# Patient Record
Sex: Female | Born: 1995 | Race: White | Hispanic: No | Marital: Single | Smoking: Never smoker
Health system: Southern US, Community
[De-identification: ages and names within clinical notes are randomized; demographics above are authoritative.]

## PROBLEM LIST (undated history)

## (undated) DIAGNOSIS — E7800 Pure hypercholesterolemia, unspecified: Secondary | ICD-10-CM

## (undated) DIAGNOSIS — F32A Depression, unspecified: Secondary | ICD-10-CM

## (undated) DIAGNOSIS — F329 Major depressive disorder, single episode, unspecified: Secondary | ICD-10-CM

## (undated) DIAGNOSIS — F419 Anxiety disorder, unspecified: Secondary | ICD-10-CM

---

## 2014-03-25 DIAGNOSIS — F331 Major depressive disorder, recurrent, moderate: Secondary | ICD-10-CM | POA: Insufficient documentation

## 2014-03-25 DIAGNOSIS — G43909 Migraine, unspecified, not intractable, without status migrainosus: Secondary | ICD-10-CM | POA: Insufficient documentation

## 2014-03-25 DIAGNOSIS — F3281 Premenstrual dysphoric disorder: Secondary | ICD-10-CM | POA: Insufficient documentation

## 2014-04-21 DIAGNOSIS — F4010 Social phobia, unspecified: Secondary | ICD-10-CM | POA: Insufficient documentation

## 2017-11-13 ENCOUNTER — Encounter (HOSPITAL_COMMUNITY): Payer: Self-pay | Admitting: Emergency Medicine

## 2017-11-13 ENCOUNTER — Emergency Department (HOSPITAL_COMMUNITY): Payer: BLUE CROSS/BLUE SHIELD

## 2017-11-13 ENCOUNTER — Emergency Department (HOSPITAL_COMMUNITY)
Admission: EM | Admit: 2017-11-13 | Discharge: 2017-11-13 | Disposition: A | Discharge status: Home / Self Care | Payer: BLUE CROSS/BLUE SHIELD | Attending: Emergency Medicine | Admitting: Emergency Medicine

## 2017-11-13 ENCOUNTER — Other Ambulatory Visit: Payer: Self-pay

## 2017-11-13 DIAGNOSIS — R1031 Right lower quadrant pain: Secondary | ICD-10-CM | POA: Diagnosis not present

## 2017-11-13 DIAGNOSIS — R109 Unspecified abdominal pain: Secondary | ICD-10-CM

## 2017-11-13 DIAGNOSIS — Z3491 Encounter for supervision of normal pregnancy, unspecified, first trimester: Secondary | ICD-10-CM | POA: Diagnosis not present

## 2017-11-13 DIAGNOSIS — Z79899 Other long term (current) drug therapy: Secondary | ICD-10-CM | POA: Insufficient documentation

## 2017-11-13 HISTORY — DX: Pure hypercholesterolemia, unspecified: E78.00

## 2017-11-13 HISTORY — DX: Major depressive disorder, single episode, unspecified: F32.9

## 2017-11-13 HISTORY — DX: Depression, unspecified: F32.A

## 2017-11-13 HISTORY — DX: Anxiety disorder, unspecified: F41.9

## 2017-11-13 LAB — COMPREHENSIVE METABOLIC PANEL
ALT: 16 U/L (ref 0–44)
ANION GAP: 6 (ref 5–15)
AST: 16 U/L (ref 15–41)
Albumin: 3.9 g/dL (ref 3.5–5.0)
Alkaline Phosphatase: 73 U/L (ref 38–126)
BILIRUBIN TOTAL: 0.5 mg/dL (ref 0.3–1.2)
BUN: 6 mg/dL (ref 6–20)
CHLORIDE: 109 mmol/L (ref 98–111)
CO2: 23 mmol/L (ref 22–32)
Calcium: 9.2 mg/dL (ref 8.9–10.3)
Creatinine, Ser: 0.49 mg/dL (ref 0.44–1.00)
GFR calc Af Amer: >60 mL/min (ref >60)
Glucose, Bld: 88 mg/dL (ref 70–99)
POTASSIUM: 4.1 mmol/L (ref 3.5–5.1)
Sodium: 138 mmol/L (ref 135–145)
TOTAL PROTEIN: 6.9 g/dL (ref 6.5–8.1)

## 2017-11-13 LAB — CBC WITH DIFFERENTIAL/PLATELET
ABS IMMATURE GRANULOCYTES: 0.01 10*3/uL (ref 0.00–0.07)
BASOS ABS: 0.1 10*3/uL (ref 0.0–0.1)
BASOS PCT: 1 %
Eosinophils Absolute: 0.4 10*3/uL (ref 0.0–0.5)
Eosinophils Relative: 6 %
HCT: 38 % (ref 36.0–46.0)
Hemoglobin: 12.4 g/dL (ref 12.0–15.0)
Immature Granulocytes: 0 %
LYMPHS PCT: 32 %
Lymphs Abs: 2.3 10*3/uL (ref 0.7–4.0)
MCH: 30.2 pg (ref 26.0–34.0)
MCHC: 32.6 g/dL (ref 30.0–36.0)
MCV: 92.5 fL (ref 80.0–100.0)
MONO ABS: 0.5 10*3/uL (ref 0.1–1.0)
Monocytes Relative: 7 %
NEUTROS ABS: 3.8 10*3/uL (ref 1.7–7.7)
NRBC: 0 % (ref 0.0–0.2)
Neutrophils Relative %: 54 %
PLATELETS: 306 10*3/uL (ref 150–400)
RBC: 4.11 MIL/uL (ref 3.87–5.11)
RDW: 13.3 % (ref 11.5–15.5)
WBC: 7 10*3/uL (ref 4.0–10.5)

## 2017-11-13 LAB — URINALYSIS, ROUTINE W REFLEX MICROSCOPIC
BILIRUBIN URINE: NEGATIVE
GLUCOSE, UA: NEGATIVE mg/dL
HGB URINE DIPSTICK: NEGATIVE
KETONES UR: NEGATIVE mg/dL
LEUKOCYTES UA: NEGATIVE
Nitrite: NEGATIVE
PH: 6 (ref 5.0–8.0)
PROTEIN: NEGATIVE mg/dL
Specific Gravity, Urine: <1.005 — ABNORMAL LOW (ref 1.005–1.030)

## 2017-11-13 LAB — I-STAT BETA HCG BLOOD, ED (MC, WL, AP ONLY): I-stat hCG, quantitative: >2000 m[IU]/mL — ABNORMAL HIGH (ref <5)

## 2017-11-13 LAB — LIPASE, BLOOD: LIPASE: 38 U/L (ref 11–51)

## 2017-11-13 LAB — HCG, QUANTITATIVE, PREGNANCY: HCG, BETA CHAIN, QUANT, S: 18628 m[IU]/mL — AB (ref <5)

## 2017-11-13 MED ORDER — PRENATAL VITAMIN 27-0.8 MG PO TABS: 1.0000 | ORAL_TABLET | Freq: Every day | ORAL | 3 refills | Status: AC

## 2017-11-13 NOTE — Discharge Instructions (Signed)
We saw in the ER for abdominal pain. It is unclear why you are having the pain.  Ultrasound of your abdomen and kidneys do not show any evidence of gallstones, kidney stones, ectopic pregnancy.  We would want you to follow-up with [facility] doctor as soon as possible. Please return to the ER if your symptoms worsen; you have increased pain, fevers, chills, inability to keep any medications down. Otherwise see the outpatient doctor as requested.

## 2017-11-13 NOTE — ED Triage Notes (Signed)
Patient complaining of right flank pain x 2 weeks. States she is approximately 4-[redacted] weeks pregnant. Denies dysuria. Denies vaginal bleeding.

## 2017-11-13 NOTE — ED Provider Notes (Signed)
[facility] EMERGENCY DEPARTMENT Provider Note   CSN: [id number] Arrival date & time: 11/13/17  1056     History   Chief Complaint Chief Complaint  Patient presents with  . Flank Pain    HPI Patricia Fleming is a 22 y.o. female.  HPI  22 year old G1, P0 woman comes in with chief complaint of flank pain.  Patient states that for the past 2 weeks she has been having intermittent episodes of right-sided abdominal and flank pain.  Her pain is located in the right lower quadrant and also in the right flank and right hip.  Pain is unprovoked, and it appears that it is worse at nighttime.  She denies any specific evoking factors or any specific aggravating or relieving factors.  Patient does not have any nausea, vomiting, fevers, chills, vaginal bleeding, UTI-like symptoms.  Past Medical History:  Diagnosis Date  . Anxiety   . Depression   . High cholesterol     There are no active problems to display for this patient.   History reviewed. No pertinent surgical history.   OB History    Gravida  1   Para      Term      Preterm      AB      Living        SAB      TAB      Ectopic      Multiple      Live Births               Home Medications    Prior to Admission medications   Medication Sig Start Date End Date Taking? Authorizing Provider  atorvastatin (LIPITOR) 20 MG tablet Take 20 mg by mouth at bedtime. 09/06/17  Yes [provider]  FLUoxetine (PROZAC) 10 MG capsule TAKE 1 CAPSULE BY MOUTH IN THE MORNING 10/17/17  Yes [provider]  levocetirizine (XYZAL) 2.5 MG/5ML solution Take 5 mg by mouth every evening.   Yes [provider]  LORazepam (ATIVAN) 0.5 MG tablet Take 0.5 mg by mouth 2 (two) times daily as needed. 09/24/17  Yes [provider]  montelukast (SINGULAIR) 10 MG tablet TAKE 1 TABLET BY MOUTH ONCE DAILY IN THE EVENING FOR 30 DAYS 10/28/17  Yes [provider]  PROAIR RESPICLICK 108 (90 Base)  MCG/ACT AEPB INHALE 1 TO 2 PUFFS BY MOUTH EVERY 4 TO 6 HOURS AS NEEDED FOR COUGH FOR 90 DAYS 09/18/17  Yes [provider]  amoxicillin-clavulanate (AUGMENTIN) 875-125 MG tablet Augmentin 875 mg-125 mg tablet  Take 1 tablet every 12 hours by oral route with meals.    [provider]  azelastine (OPTIVAR) 0.05 % ophthalmic solution Place into both eyes as needed (when on set occurs).  09/18/17   [provider]  azithromycin (ZITHROMAX) 1 g powder azithromycin 1 gram oral packet  Take 1 g by oral route.    [provider]  azithromycin (ZITHROMAX) 250 MG tablet Zithromax Z-Pak 250 mg tablet  TAKE 2 TABLETS (500 MG) BY ORAL ROUTE ONCE DAILY FOR 1 DAY THEN 1 TABLET (250 MG) BY ORAL ROUTE ONCE DAILY FOR 4 DAYS    [provider]  Prenatal Vit-Fe Fumarate-FA (PRENATAL VITAMIN) 27-0.8 MG TABS Take 1 tablet by mouth daily. 11/13/17 02/11/18  Derwood Kaplan, MD  prochlorperazine (COMPAZINE) 10 MG tablet Take 10 mg by mouth daily as needed for nausea or vomiting.  09/06/17   [provider]  SUMAtriptan (IMITREX) 25 MG  tablet as needed for migraine.  09/06/17   [provider]  Vitamin D, Ergocalciferol, (DRISDOL) 1.25 MG (50000 UT) CAPS capsule Take 50,000 Units by mouth once a week. 09/06/17   [provider]    Family History History reviewed. No pertinent family history.  Social History Social History   Tobacco Use  . Smoking status: Never Smoker  . Smokeless tobacco: Never Used  Substance Use Topics  . Alcohol use: Yes    Comment: occasionally  . Drug use: Yes    Types: Marijuana    Comment: occasionally     Allergies   Toradol [ketorolac tromethamine]   Review of Systems Review of Systems  Constitutional: Positive for activity change.  Respiratory: Negative for shortness of breath.   Cardiovascular: Negative for chest pain.  Gastrointestinal: Positive for abdominal pain. Negative for nausea and vomiting.    Genitourinary: Negative for dysuria and hematuria.  All other systems reviewed and are negative.    Physical Exam Updated Vital Signs BP 112/76   Pulse (!) 38   Temp (!) 97.5 F (36.4 C) (Oral)   Resp 17   Ht 5\' 2"  (1.575 m)   Wt 64 kg   LMP 09/22/2017   SpO2 100%   BMI 25.79 kg/m   Physical Exam  Constitutional: She is oriented to person, place, and time. She appears well-developed.  HENT:  Head: Normocephalic and atraumatic.  Eyes: EOM are normal.  Neck: Normal range of motion. Neck supple.  Cardiovascular: Normal rate.  Pulmonary/Chest: Effort normal.  Abdominal: Bowel sounds are normal. There is tenderness.  Right lower quadrant, upper quadrant and right flank tenderness. Hip range of motion is normal.  Neurological: She is alert and oriented to person, place, and time.  Skin: Skin is warm and dry.  Nursing note and vitals reviewed.    ED Treatments / Results  Labs (all labs ordered are listed, but only abnormal results are displayed) Labs Reviewed  URINALYSIS, ROUTINE W REFLEX MICROSCOPIC - Abnormal; Notable for the following components:      Result Value   Specific Gravity, Urine <1.005 (*)    All other components within normal limits  HCG, QUANTITATIVE, PREGNANCY - Abnormal; Notable for the following components:   hCG, Beta Chain, Quant, S 18,628 (*)    All other components within normal limits  I-STAT BETA HCG BLOOD, ED ([facility], [facility], [facility] ONLY) - Abnormal; Notable for the following components:   I-stat hCG, quantitative >2,000.0 (*)    All other components within normal limits  URINE CULTURE  COMPREHENSIVE METABOLIC PANEL  CBC WITH DIFFERENTIAL/PLATELET  LIPASE, BLOOD    EKG None  Radiology US Abdomen Complete  Result Date: 11/13/2017 CLINICAL DATA:  Right flank pain. EXAM: ABDOMEN ULTRASOUND COMPLETE COMPARISON:  None. FINDINGS: Gallbladder: No gallstones or wall thickening visualized. No sonographic Murphy sign noted by sonographer. Common bile duct:  Diameter: 3 mm which is within normal limits. Liver: No focal lesion identified. Within normal limits in parenchymal echogenicity. Portal vein is patent on color Doppler imaging with normal direction of blood flow towards the liver. IVC: No abnormality visualized. Pancreas: Visualized portion unremarkable. Spleen: Size and appearance within normal limits. Right Kidney: Length: 11.7 cm. Echogenicity within normal limits. No mass or hydronephrosis visualized. Left Kidney: Length: 11.1 cm. Echogenicity within normal limits. No mass or hydronephrosis visualized. Abdominal aorta: No aneurysm visualized. Other findings: None. IMPRESSION: No definite abnormality seen in the abdomen. Electronically Signed   By: Lupita Raider, M.D.   On:  11/13/2017 14:32   US Ob Comp Less 14 Wks  Result Date: 11/13/2017 CLINICAL DATA:  RIGHT upper quadrant and RIGHT flank pain. Gestational age by last menstrual period 7 weeks and 3 days. EXAM: OBSTETRIC <14 WK [place] AND TRANSVAGINAL OB US TECHNIQUE: Both transabdominal and transvaginal ultrasound examinations were performed for complete evaluation of the gestation as well as the maternal uterus, adnexal regions, and pelvic cul-de-sac. Transvaginal technique was performed to assess early pregnancy. COMPARISON:  None. FINDINGS: Intrauterine gestational sac: Present. Yolk sac:  Present. Embryo:  Present. Cardiac Activity: Present. Heart Rate: 101 bpm CRL:  53 mm   6 w   2 d                  [place] EDC: June 29, 2018 Subchorionic hemorrhage:  None visualized. Maternal uterus/adnexae: Normal appearance of the adnexa. No free fluid. IMPRESSION: Single live intrauterine pregnancy, gestational age by ultrasound 6 weeks and 2 days without immediate complication. Electronically Signed   By: Awilda Metro M.D.   On: 11/13/2017 14:35   US Ob Transvaginal  Result Date: 11/13/2017 CLINICAL DATA:  RIGHT upper quadrant and RIGHT flank pain. Gestational age by last menstrual period 7 weeks and 3 days.  EXAM: OBSTETRIC <14 WK [place] AND TRANSVAGINAL OB US TECHNIQUE: Both transabdominal and transvaginal ultrasound examinations were performed for complete evaluation of the gestation as well as the maternal uterus, adnexal regions, and pelvic cul-de-sac. Transvaginal technique was performed to assess early pregnancy. COMPARISON:  None. FINDINGS: Intrauterine gestational sac: Present. Yolk sac:  Present. Embryo:  Present. Cardiac Activity: Present. Heart Rate: 101 bpm CRL:  53 mm   6 w   2 d                  [place] EDC: June 29, 2018 Subchorionic hemorrhage:  None visualized. Maternal uterus/adnexae: Normal appearance of the adnexa. No free fluid. IMPRESSION: Single live intrauterine pregnancy, gestational age by ultrasound 6 weeks and 2 days without immediate complication. Electronically Signed   By: Awilda Metro M.D.   On: 11/13/2017 14:35    Procedures Procedures (including critical care time)  Medications Ordered in ED Medications - No data to display   Initial Impression / Assessment and Plan / ED Course  I have reviewed the triage vital signs and the nursing notes.  Pertinent labs & imaging results that were available during my care of the patient were reviewed by me and considered in my medical decision making (see chart for details).  Clinical Course as of Nov 14 1610  Wed Nov 13, 2017  1612 Results from the ER workup discussed with the patient face to face and all questions answered to the best of my ability.    [AN]  1612 Repeat evaluation is unchanged.  Stable for discharge.   [AN]    Clinical Course User Index [AN] Derwood Kaplan, MD    22 year old G1, P0 female comes in with chief complaint of abdominal pain and flank pain.  On exam she has right upper and lower abdominal pain without any Murphy's or McBurney's.  She has no peritoneal findings at all.  She also has flank tenderness.  Differential diagnosis would include ectopic pregnancy, ureteral stone, gallstones.  Does not  sound like a round ligament type pain.  Hip exam is normal.  Patient does not have any peritoneal findings and the pain is been off and on for past few days making appendicitis less likely as well.  Basic labs has been ordered  along with ultrasound.  Final Clinical Impressions(s) / ED Diagnoses   Final diagnoses:  Right flank pain  Right lower quadrant pain  Normal IUP (intrauterine pregnancy) on prenatal ultrasound, first trimester    ED Discharge Orders         Ordered    Prenatal Vit-Fe Fumarate-FA (PRENATAL VITAMIN) 27-0.8 MG TABS  Daily     11/13/17 1611           Derwood Kaplan, MD 11/13/17 1612

## 2017-11-14 LAB — URINE CULTURE: Culture: NO GROWTH

## 2018-12-22 ENCOUNTER — Other Ambulatory Visit: Payer: Self-pay

## 2018-12-22 ENCOUNTER — Encounter (HOSPITAL_COMMUNITY): Payer: Self-pay | Admitting: Emergency Medicine

## 2018-12-22 ENCOUNTER — Emergency Department (HOSPITAL_COMMUNITY)
Admission: EM | Admit: 2018-12-22 | Discharge: 2018-12-22 | Disposition: A | Discharge status: Home / Self Care | Payer: BLUE CROSS/BLUE SHIELD | Attending: Emergency Medicine | Admitting: Emergency Medicine

## 2018-12-22 DIAGNOSIS — Z20822 Contact with and (suspected) exposure to covid-19: Secondary | ICD-10-CM

## 2018-12-22 DIAGNOSIS — Z20828 Contact with and (suspected) exposure to other viral communicable diseases: Secondary | ICD-10-CM | POA: Insufficient documentation

## 2018-12-22 DIAGNOSIS — J069 Acute upper respiratory infection, unspecified: Secondary | ICD-10-CM | POA: Insufficient documentation

## 2018-12-22 DIAGNOSIS — Z79899 Other long term (current) drug therapy: Secondary | ICD-10-CM | POA: Insufficient documentation

## 2018-12-22 LAB — GROUP A STREP BY PCR: Group A Strep by PCR: NOT DETECTED

## 2018-12-22 MED ORDER — FLUTICASONE PROPIONATE 50 MCG/ACT NA SUSP: 2.0000 | Freq: Every day | NASAL | 0 refills | Status: DC

## 2018-12-22 NOTE — ED Triage Notes (Signed)
Pt reports sore throat, general malaise, and congestion since Friday with no fever.

## 2018-12-22 NOTE — Discharge Instructions (Addendum)
You may use over-the-counter cough syrup as needed.  I have prescribed you with a nasal steroid for congestion.  I also suggest warm salt water gargles.  Your strep test was negative.  Your Covid test is pending.  You will be called if this is positive.  If you develop severe chest pain, shortness of breath, coughing up blood please seek reevaluation emergency department

## 2018-12-22 NOTE — ED Provider Notes (Signed)
[facility] EMERGENCY DEPARTMENT Provider Note   CSN: [id number] Arrival date & time: 12/22/18  1510    History Chief Complaint  Patient presents with  . Sore Throat    Patricia Fleming is a 23 y.o. female with past medical history who presents for evaluation of upper respiratory symptoms.  Patient with nasal congestion, sore throat, nonproductive cough.  Has been able to tolerate p.o. intake at home.  Denies fever, chills, nausea, vomiting, neck pain, neck stiffness, facial swelling, chest pain, shortness of breath, hemoptysis abdominal pain, diarrhea, dysuria.  Denies additional aggravating or alleviating factors.  Has not take anything for symptoms.  Describes her sore throat is "scratchy."  States she does have some rhinorrhea, clear in nature.  Denies additional aggravating or alleviating factors. Denies chance of pregnancy.  History obtained from patient and past medical records. No interpretor was used.  HPI     Past Medical History:  Diagnosis Date  . Anxiety   . Depression   . High cholesterol     There are no problems to display for this patient.   History reviewed. No pertinent surgical history.   OB History    Gravida  1   Para      Term      Preterm      AB      Living        SAB      TAB      Ectopic      Multiple      Live Births              History reviewed. No pertinent family history.  Social History   Tobacco Use  . Smoking status: Never Smoker  . Smokeless tobacco: Never Used  Substance Use Topics  . Alcohol use: Yes    Comment: occasionally  . Drug use: Yes    Types: Marijuana    Comment: occasionally    Home Medications Prior to Admission medications   Medication Sig Start Date End Date Taking? Authorizing Provider  loratadine (CLARITIN) 10 MG tablet Take 10 mg by mouth daily.   Yes [provider]  PROAIR RESPICLICK 108 (90 Base) MCG/ACT AEPB Inhale 1-2 puffs into the lungs every 6 (six) hours as needed  (for shortness of breath).  09/18/17  Yes [provider]  fluticasone (FLONASE) 50 MCG/ACT nasal spray Place 2 sprays into both nostrils daily. 12/22/18   Talley Casco A, PA-C    Allergies    Nsaids and Toradol [ketorolac tromethamine]  Review of Systems   Review of Systems  Constitutional: Negative.   HENT: Positive for congestion, postnasal drip, rhinorrhea, sinus pressure and sore throat. Negative for ear discharge, ear pain, facial swelling, nosebleeds, sinus pain, sneezing, tinnitus, trouble swallowing and voice change.   Eyes: Negative.   Respiratory: Positive for cough. Negative for apnea, choking, chest tightness, shortness of breath, wheezing and stridor.   Cardiovascular: Negative.   Gastrointestinal: Negative.   Genitourinary: Negative.   Musculoskeletal: Negative.   Skin: Negative.   All other systems reviewed and are negative.   Physical Exam Updated Vital Signs BP 113/75   Pulse 85   Temp 97.8 F (36.6 C) (Oral)   Resp 20   Ht 5\' 2"  (1.575 m)   Wt 68 kg   LMP 11/24/2018   SpO2 100%   Breastfeeding Unknown   BMI 27.44 kg/m   Physical Exam Vitals and nursing note reviewed.  Constitutional:      General:  She is not in acute distress.    Appearance: She is not ill-appearing, toxic-appearing or diaphoretic.  HENT:     Head: Normocephalic and atraumatic.     Jaw: There is normal jaw occlusion.     Right Ear: Tympanic membrane, ear canal and external ear normal. There is no impacted cerumen. No hemotympanum. Tympanic membrane is not injected, scarred, perforated, erythematous, retracted or bulging.     Left Ear: Tympanic membrane, ear canal and external ear normal. There is no impacted cerumen. No hemotympanum. Tympanic membrane is not injected, scarred, perforated, erythematous, retracted or bulging.     Ears:     Comments: No Mastoid tenderness.    Nose:     Comments: Clear rhinorrhea and congestion to bilateral nares.  No sinus tenderness.     Mouth/Throat:     Comments: Posterior oropharynx clear.  Mucous membranes moist.  Tonsils without erythema or exudate.  Uvula midline without deviation.  No evidence of PTA or RPA.  No drooling, dysphasia or trismus.  Phonation normal. Neck:     Trachea: Trachea and phonation normal.     Meningeal: Brudzinski's sign and Kernig's sign absent.     Comments: No Neck stiffness or neck rigidity.  No meningismus.  No cervical lymphadenopathy. Cardiovascular:     Comments: No murmurs rubs or gallops. Pulmonary:     Comments: Clear to auscultation bilaterally without wheeze, rhonchi or rales.  No accessory muscle usage.  Able speak in full sentences. Abdominal:     Comments: Soft, nontender without rebound or guarding.  No CVA tenderness.  Musculoskeletal:     Comments: Moves all 4 extremities without difficulty.  Lower extremities without edema, erythema or warmth.  Skin:    Comments: Brisk capillary refill.  No rashes or lesions.  Neurological:     Mental Status: She is alert.     Comments: Ambulatory in department without difficulty.  Cranial nerves II through XII grossly intact.  No facial droop.  No aphasia.    ED Results / Procedures / Treatments   Labs (all labs ordered are listed, but only abnormal results are displayed) Labs Reviewed  GROUP A STREP BY PCR  SARS CORONAVIRUS 2 (TAT 6-24 HRS)    EKG None  Radiology No results found.  Procedures Procedures (including critical care time)  Medications Ordered in ED Medications - No data to display  ED Course  I have reviewed the triage vital signs and the nursing notes.  Pertinent labs & imaging results that were available during my care of the patient were reviewed by me and considered in my medical decision making (see chart for details).  23 year old he has otherwise well presents for evaluation of upper respiratory complaints.  She is afebrile, nonseptic, not ill-appearing.  No tachycardia, tachypnea or hypoxia.  Heart  and lungs clear.  She does have some mild posterior oropharynx erythema however no tonsillar edema or exudate.  Uvula midline without deviation.  No evidence of PTA or RPA.  No facial swelling.  She is tolerating p.o. intake without difficulty.  No drooling, dysphagia or trismus.  No known Covid exposures.  No neck stiffness or neck rigidity.  No meningismus.  No evidence of otitis.  Given nonproductive cough with stable vital signs do not think patient needs chest x-ray as I have low suspicion for acute bacterial pneumonia as cause of her symptoms.  No purulent rhinorrhea to suggest bacterial sinusitis.  Strep negative COVID pending  Patient to be discharged home with symptomatic management.  She is to follow-up with PCP for any new worsening symptoms.  She understands to stay out of work until her Covid test has resulted.  The patient has been appropriately medically screened and/or stabilized in the ED. I have low suspicion for any other emergent medical condition which would require further screening, evaluation or treatment in the ED or require inpatient management.  Patient is hemodynamically stable and in no acute distress.  Patient able to ambulate in department prior to ED.  Evaluation does not show acute pathology that would require ongoing or additional emergent interventions while in the emergency department or further inpatient treatment.  I have discussed the diagnosis with the patient and answered all questions.  Pain is been managed while in the emergency department and patient has no further complaints prior to discharge.  Patient is comfortable with plan discussed in room and is stable for discharge at this time.  I have discussed strict return precautions for returning to the emergency department.  Patient was encouraged to follow-up with PCP/specialist refer to at discharge.  Patricia RowanKristian Bodley was evaluated in Emergency Department on 12/22/2018 for the symptoms described in the history of  present illness. She was evaluated in the context of the global COVID-19 pandemic, which necessitated consideration that the patient might be at risk for infection with the SARS-CoV-2 virus that causes COVID-19. Institutional protocols and algorithms that pertain to the evaluation of patients at risk for COVID-19 are in a state of rapid change based on information released by regulatory bodies including the CDC and federal and state organizations. These policies and algorithms were followed during the patient's care in the ED.   MDM Rules/Calculators/A&P                      Final Clinical Impression(s) / ED Diagnoses Final diagnoses:  COVID-19 virus test result unknown  Viral upper respiratory tract infection    Rx / DC Orders ED Discharge Orders         Ordered    fluticasone (FLONASE) 50 MCG/ACT nasal spray  Daily     12/22/18 1808           Nyhla Mountjoy A, PA-C 12/22/18 1851    Geoffery Lyonselo, [place], MD 12/22/18 2250

## 2018-12-23 ENCOUNTER — Telehealth: Payer: Self-pay | Admitting: Physician Assistant

## 2018-12-23 DIAGNOSIS — J019 Acute sinusitis, unspecified: Secondary | ICD-10-CM

## 2018-12-23 LAB — SARS CORONAVIRUS 2 (TAT 6-24 HRS): SARS Coronavirus 2: NEGATIVE

## 2018-12-23 MED ORDER — AMOXICILLIN-POT CLAVULANATE 875-125 MG PO TABS: 1.0000 | ORAL_TABLET | Freq: Two times a day (BID) | ORAL | 0 refills | Status: DC

## 2018-12-23 NOTE — Progress Notes (Signed)
We are sorry that you are not feeling well.  Thanks for providing all of the additional information.  I think it is reasonable that we can treat you with an antibiotic.  I do recommend continuing to use the Flonase.  Although it will not acutely make things better, over the next week or so it will help the inflammation in your nose and the drainage to ensure that your infection improves.  Based on what you have shared with me it looks like you have sinusitis.  Sinusitis is inflammation and infection in the sinus cavities of the head.  Based on your presentation I believe you most likely have Acute Bacterial Sinusitis.  This is an infection caused by bacteria and is treated with antibiotics. I have prescribed Augmentin 875mg /125mg  one tablet twice daily with food, for 7 days. You may use an oral decongestant such as Mucinex D or if you have glaucoma or high blood pressure use plain Mucinex. Saline nasal spray help and can safely be used as often as needed for congestion.  If you develop worsening sinus pain, fever or notice severe headache and vision changes, or if symptoms are not better after completion of antibiotic, please schedule an appointment with a health care provider.    Sinus infections are not as easily transmitted as other respiratory infection, however we still recommend that you avoid close contact with loved ones, especially the very young and elderly.  Remember to wash your hands thoroughly throughout the day as this is the number one way to prevent the spread of infection!  Home Care:  Only take medications as instructed by your medical team.  Complete the entire course of an antibiotic.  Do not take these medications with alcohol.  A steam or ultrasonic humidifier can help congestion.  You can place a towel over your head and breathe in the steam from hot water coming from a faucet.  Avoid close contacts especially the very young and the elderly.  Cover your mouth when you cough  or sneeze.  Always remember to wash your hands.  Get Help Right Away If:  You develop worsening fever or sinus pain.  You develop a severe head ache or visual changes.  Your symptoms persist after you have completed your treatment plan.  Make sure you  Understand these instructions.  Will watch your condition.  Will get help right away if you are not doing well or get worse.  Your e-visit answers were reviewed by a board certified advanced clinical practitioner to complete your personal care plan.  Depending on the condition, your plan could have included both over the counter or prescription medications.  If there is a problem please reply  once you have received a response from your provider.  Your safety is important to [place].  If you have drug allergies check your prescription carefully.    You can use MyChart to ask questions about today's visit, request a non-urgent call back, or ask for a work or school excuse for 24 hours related to this e-Visit. If it has been greater than 24 hours you will need to follow up with your provider, or enter a new e-Visit to address those concerns.  You will get an e-mail in the next two days asking about your experience.  I hope that your e-visit has been valuable and will speed your recovery. Thank you for using e-visits.  Greater than 5 minutes, yet less than 10 minutes of time have been spent researching, coordinating, and  implementing care for this patient today

## 2019-07-23 ENCOUNTER — Telehealth: Payer: Self-pay | Admitting: Physician Assistant

## 2019-07-23 DIAGNOSIS — J019 Acute sinusitis, unspecified: Secondary | ICD-10-CM

## 2019-07-23 MED ORDER — PROAIR RESPICLICK 108 (90 BASE) MCG/ACT IN AEPB: 2.0000 | INHALATION_SPRAY | Freq: Four times a day (QID) | RESPIRATORY_TRACT | 0 refills | Status: DC | PRN

## 2019-07-23 MED ORDER — FLUTICASONE PROPIONATE 50 MCG/ACT NA SUSP: 2.0000 | Freq: Every day | NASAL | 0 refills | Status: DC

## 2019-07-23 MED ORDER — AMOXICILLIN-POT CLAVULANATE 875-125 MG PO TABS: 1.0000 | ORAL_TABLET | Freq: Two times a day (BID) | ORAL | 0 refills | Status: DC

## 2019-07-23 NOTE — Progress Notes (Signed)
We are sorry that you are not feeling well.  Here is how we plan to help!  Based on what you have shared with me it looks like you have sinusitis.  Sinusitis is inflammation and infection in the sinus cavities of the head.  Based on your presentation I believe you most likely have Acute Bacterial Sinusitis.  This is an infection caused by bacteria and is treated with antibiotics. I have prescribed Augmentin 875mg /125mg  one tablet twice daily with food, for 7 days.  I have also prescribed Fluticasone nasal spray and have refilled your inhaler. You may use an oral decongestant such as Mucinex D or if you have glaucoma or high blood pressure use plain Mucinex. Saline nasal spray help and can safely be used as often as needed for congestion.  If you develop worsening sinus pain, fever or notice severe headache and vision changes, or if symptoms are not better after completion of antibiotic, please schedule an appointment with a health care provider.    Sinus infections are not as easily transmitted as other respiratory infection, however we still recommend that you avoid close contact with loved ones, especially the very young and elderly.  Remember to wash your hands thoroughly throughout the day as this is the number one way to prevent the spread of infection!  Home Care:  Only take medications as instructed by your medical team.  Complete the entire course of an antibiotic.  Do not take these medications with alcohol.  A steam or ultrasonic humidifier can help congestion.  You can place a towel over your head and breathe in the steam from hot water coming from a faucet.  Avoid close contacts especially the very young and the elderly.  Cover your mouth when you cough or sneeze.  Always remember to wash your hands.  Get Help Right Away If:  You develop worsening fever or sinus pain.  You develop a severe head ache or visual changes.  Your symptoms persist after you have completed your  treatment plan.  Make sure you  Understand these instructions.  Will watch your condition.  Will get help right away if you are not doing well or get worse.  Your e-visit answers were reviewed by a board certified advanced clinical practitioner to complete your personal care plan.  Depending on the condition, your plan could have included both over the counter or prescription medications.  If there is a problem please reply  once you have received a response from your provider.  Your safety is important to .  If you have drug allergies check your prescription carefully.    You can use MyChart to ask questions about today's visit, request a non-urgent call back, or ask for a work or school excuse for 24 hours related to this e-Visit. If it has been greater than 24 hours you will need to follow up with your provider, or enter a new e-Visit to address those concerns.  You will get an e-mail in the next two days asking about your experience.  I hope that your e-visit has been valuable and will speed your recovery. Thank you for using e-visits.   Approximately 5 minutes was spent documenting and reviewing patient's chart.

## 2019-07-24 NOTE — Progress Notes (Signed)
Time spent: 5 min  Work note for 07/24/2019 sent. Patient messaged.

## 2019-08-05 IMAGING — US US OB COMP LESS 14 WK
1 series · 14 of 28 positions shown · non-contrast
Comparison: None.

CLINICAL DATA: RIGHT upper quadrant and RIGHT flank pain.
Gestational age by last menstrual period 7 weeks and 3 days.

EXAM:
OBSTETRIC <14 WK US AND TRANSVAGINAL OB US
TECHNIQUE: Both transabdominal and transvaginal ultrasound examinations were
performed for complete evaluation of the gestation as well as the
maternal uterus, adnexal regions, and pelvic cul-de-sac.
Transvaginal technique was performed to assess early pregnancy.

[Series 1: us ob comp less 14 wk · 0.20mm/px · 14 of 55 slices shown]
[im 3/55]
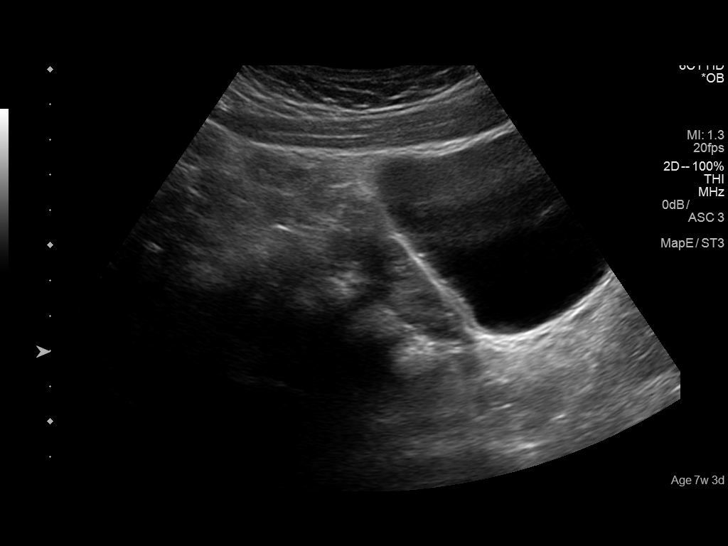
[im 7/55]
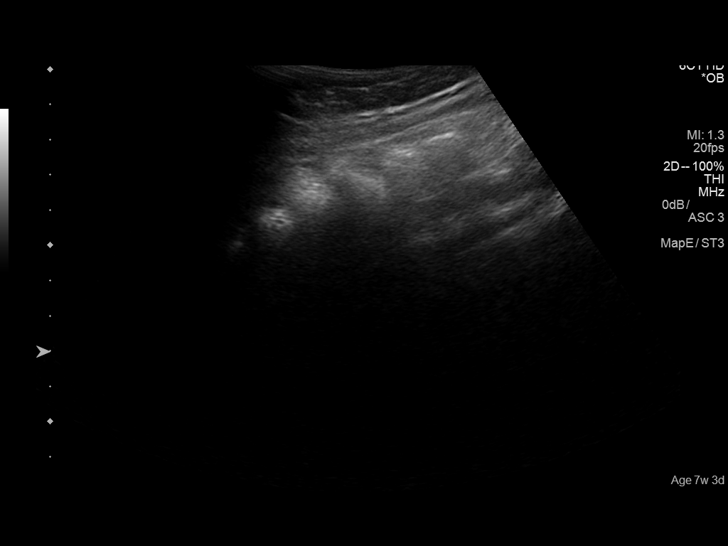
[im 11/55]
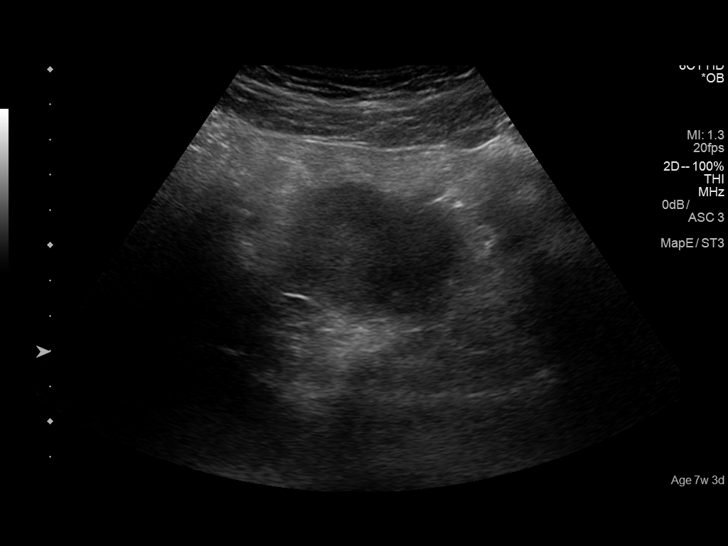
[im 15/55]
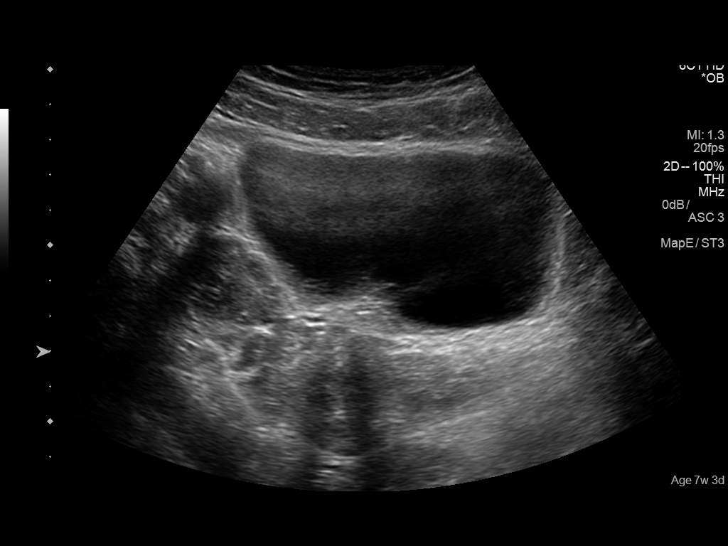
[im 19/55]
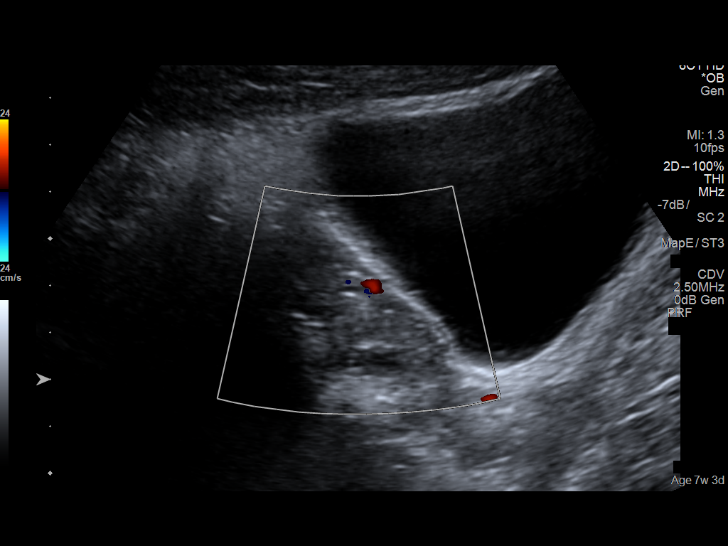
[im 23/55]
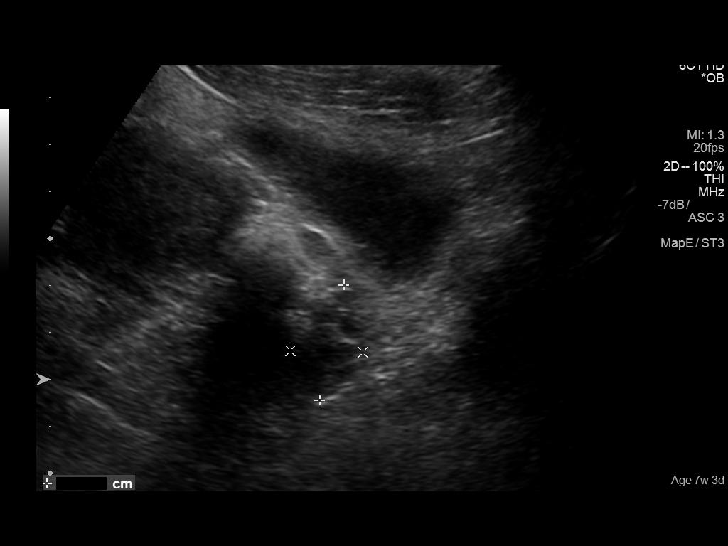
[im 27/55]
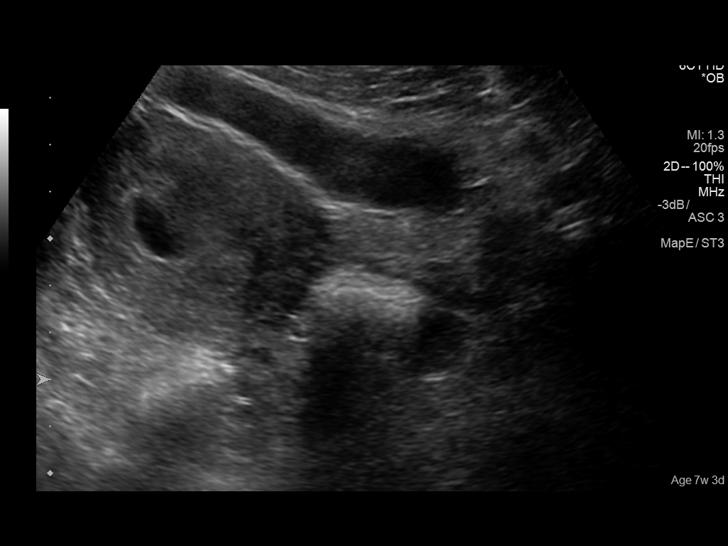
[im 31/55]
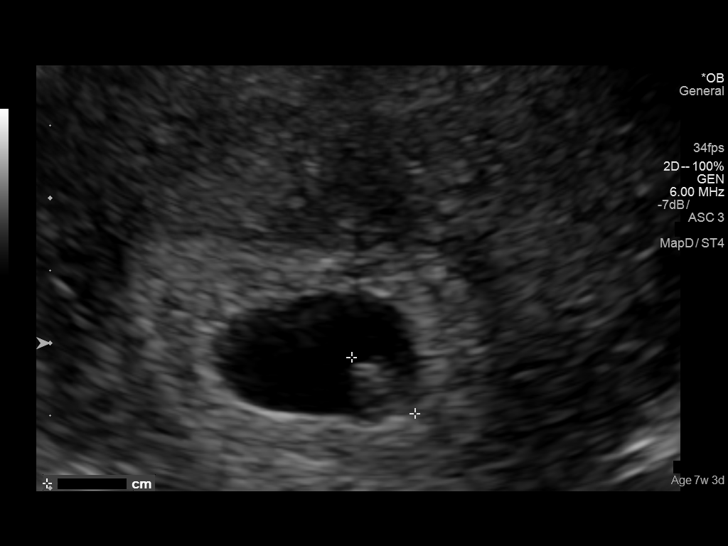
[im 35/55]
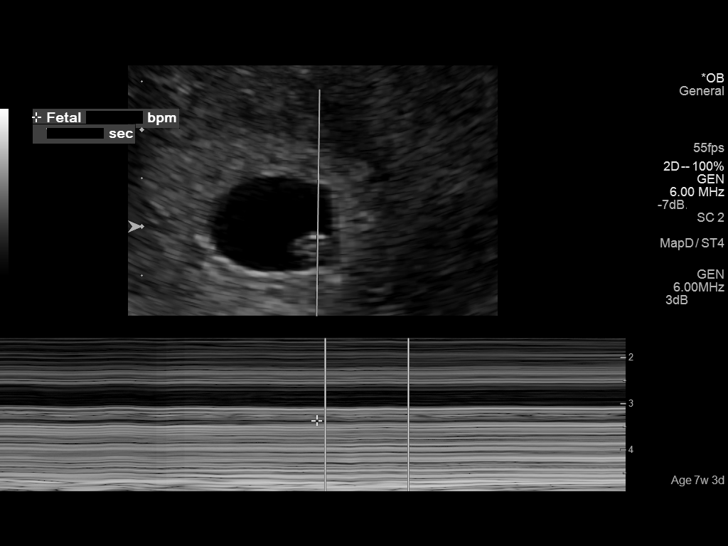
[im 39/55]
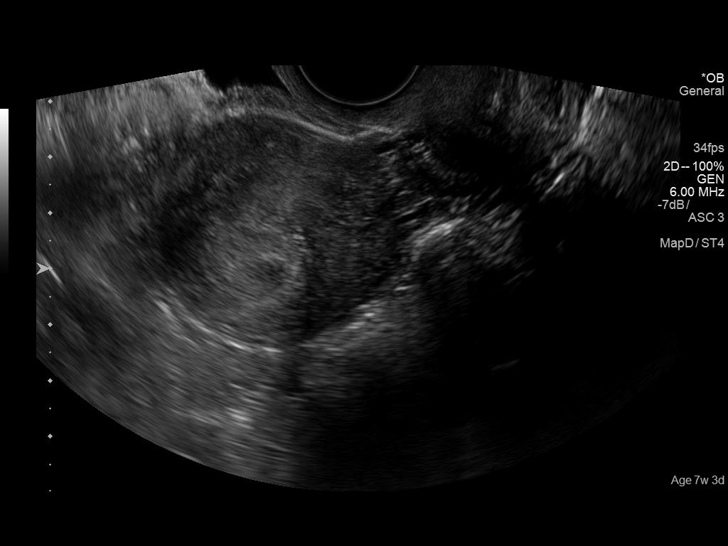
[im 43/55]
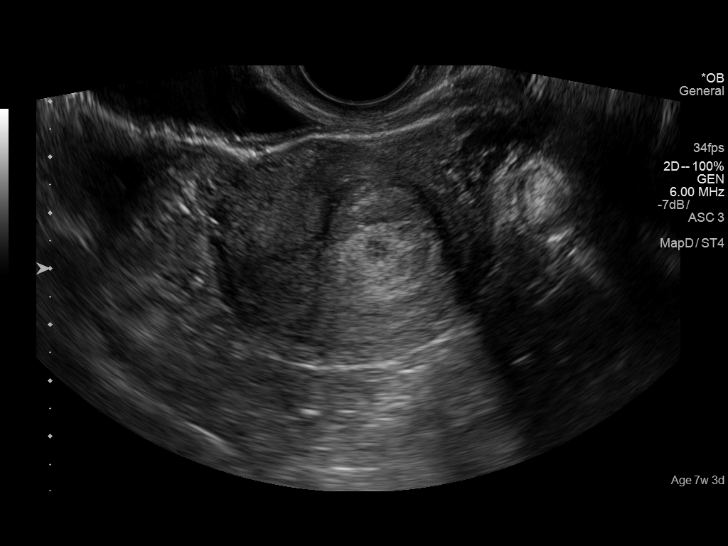
[im 47/55]
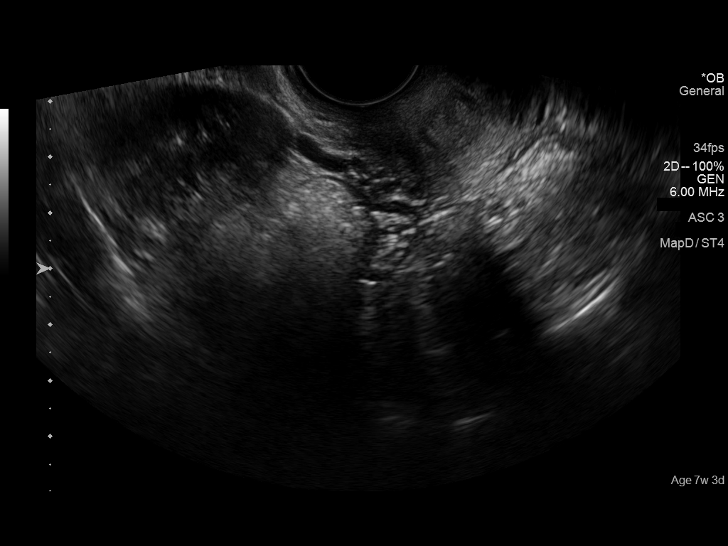
[im 51/55]
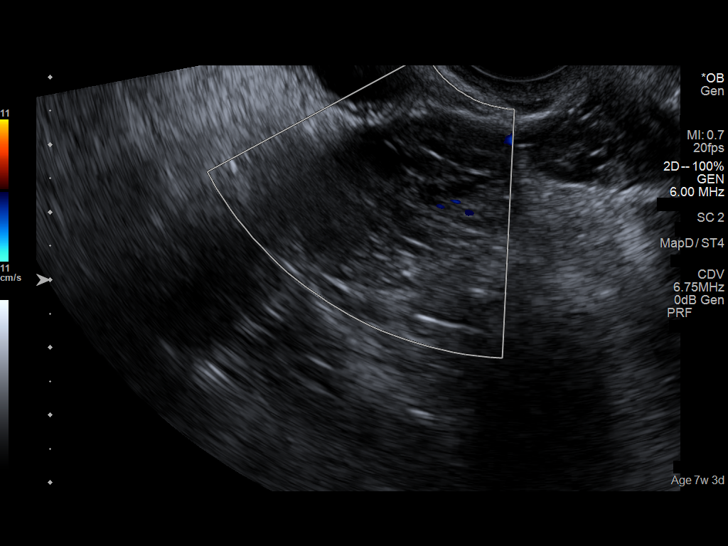
[im 55/55]
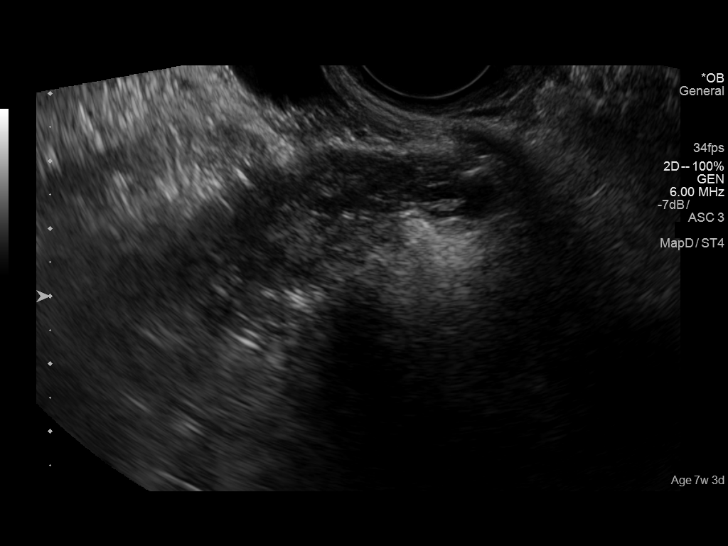

[14 of 28 positions shown; findings below may reference images not displayed]

FINDINGS: Intrauterine gestational sac: Present.

Yolk sac:  Present.

Embryo:  Present.

Cardiac Activity: Present.

Heart Rate: 101 bpm

CRL:  53 mm   6 w   2 d                  US EDC: June 29, 2018

Subchorionic hemorrhage:  None visualized.

Maternal uterus/adnexae: Normal appearance of the adnexa. No free
fluid.
IMPRESSION: Single live intrauterine pregnancy, gestational age by ultrasound 6
weeks and 2 days without immediate complication.

## 2019-08-05 IMAGING — US US ABDOMEN COMPLETE
1 series · 14 of 25 positions shown · non-contrast
Comparison: None.

CLINICAL DATA: Right flank pain.

EXAM:
ABDOMEN ULTRASOUND COMPLETE

[Series 1: us abdomen complete · 0.19mm/px · 14 of 91 slices shown]
[im 1/91]
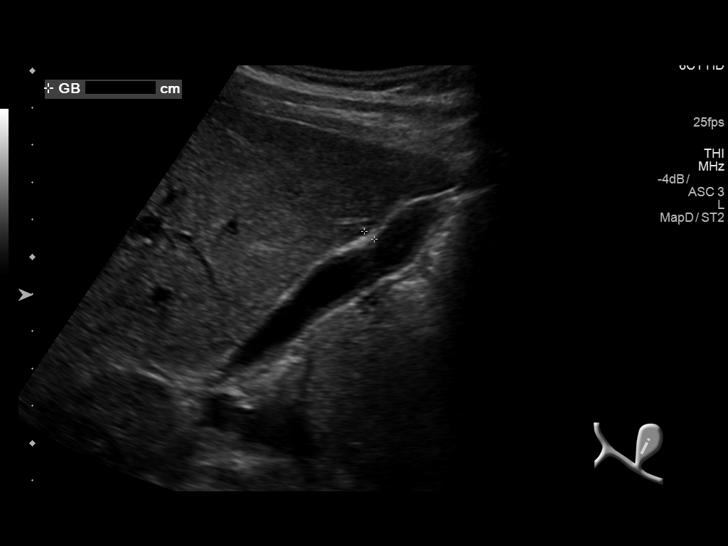
[im 8/91]
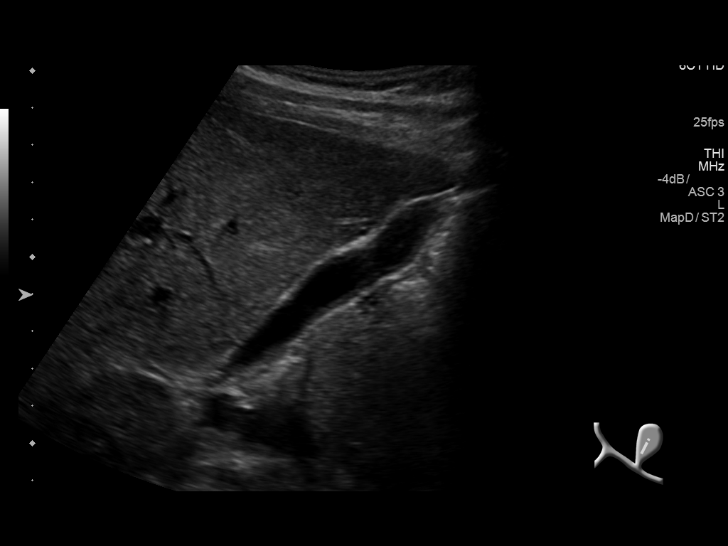
[im 16/91]
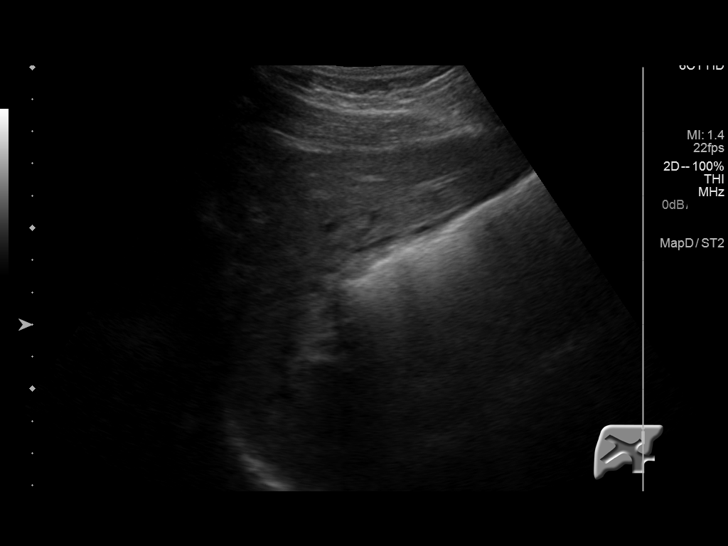
[im 23/91]
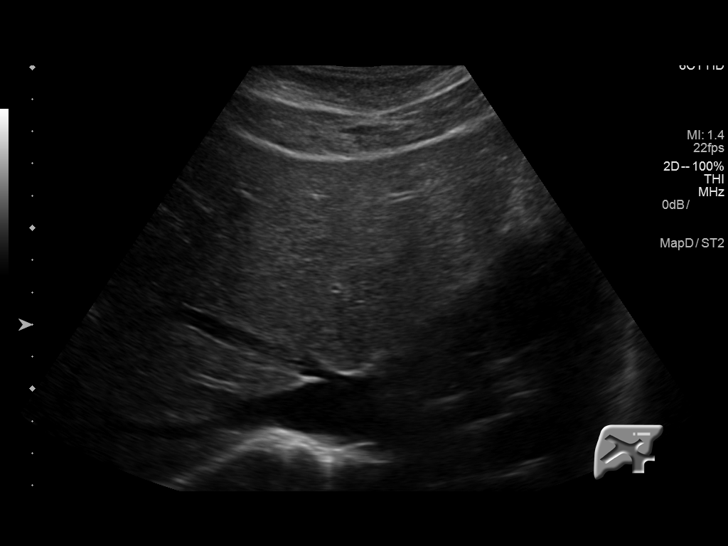
[im 31/91]
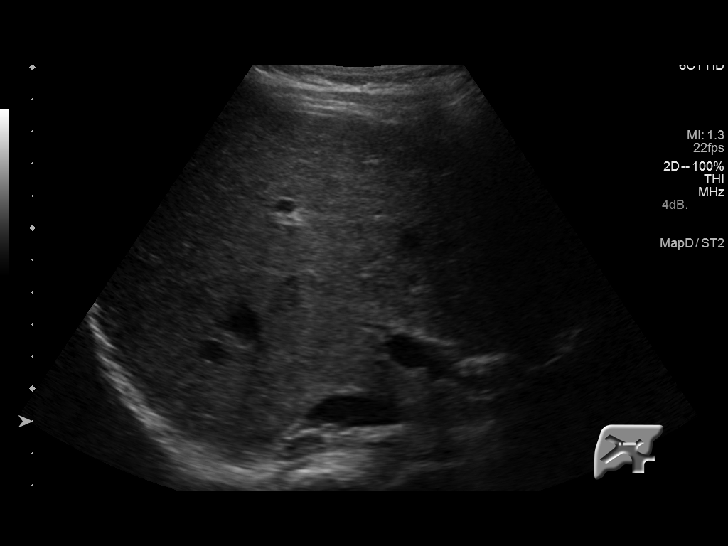
[im 34/91]
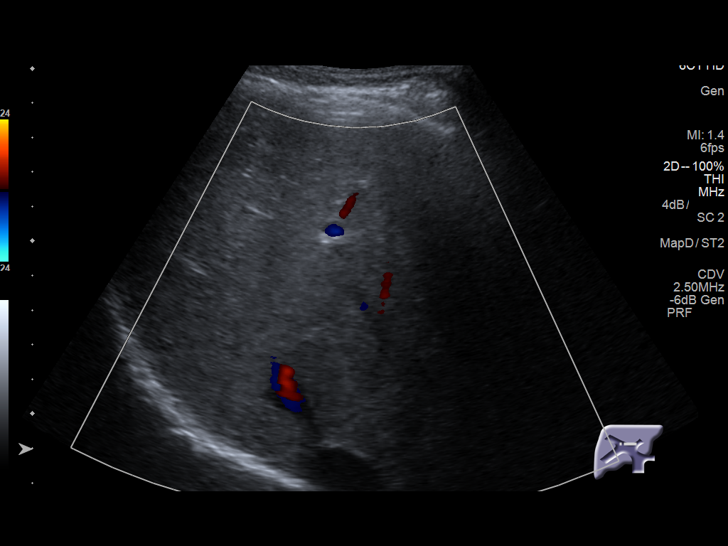
[im 42/91]
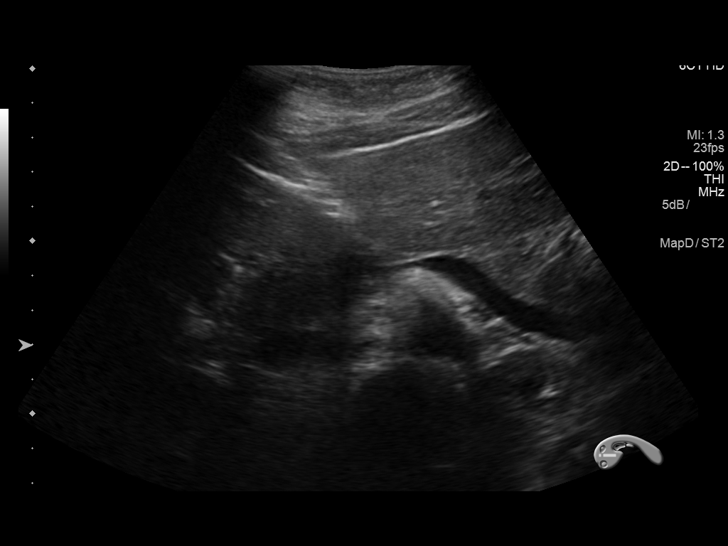
[im 49/91]
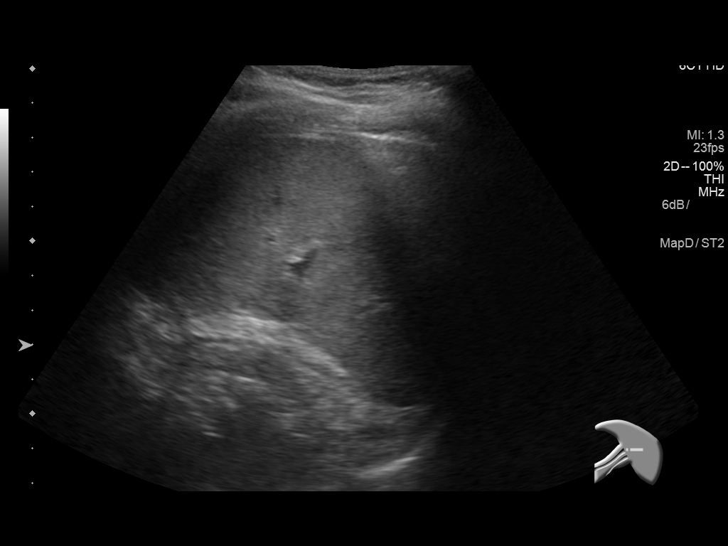
[im 57/91]
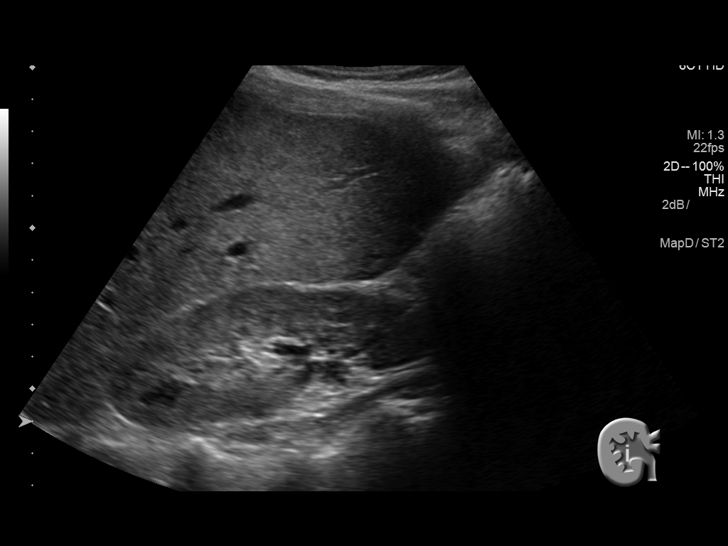
[im 61/91]
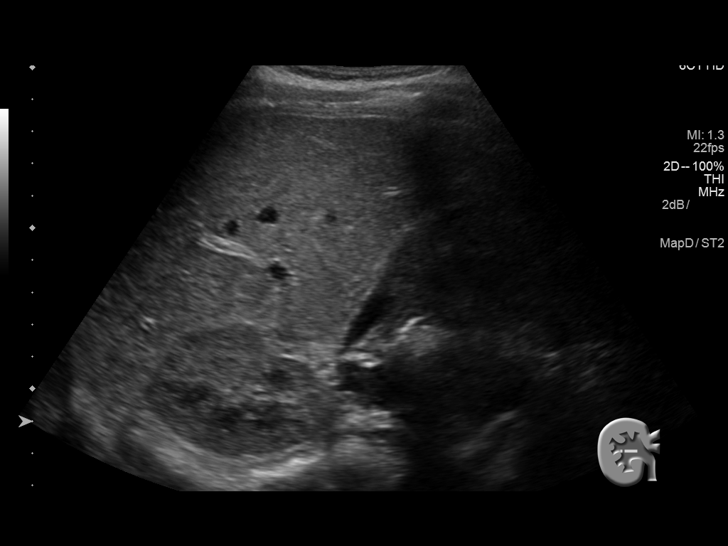
[im 68/91]
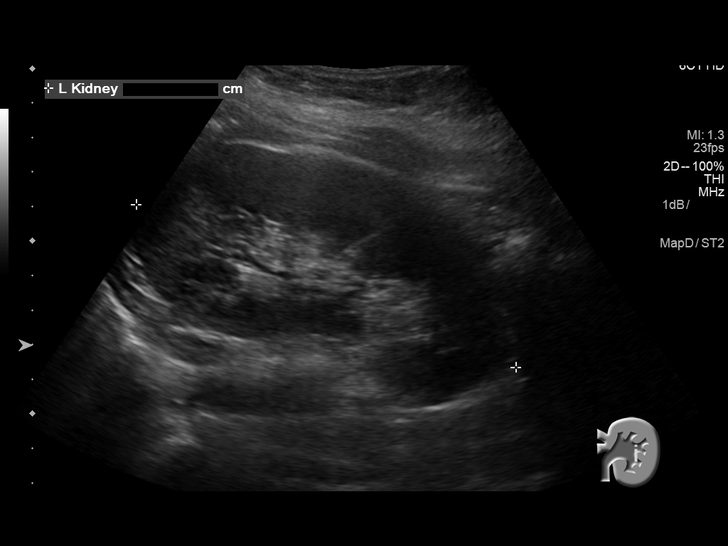
[im 76/91]
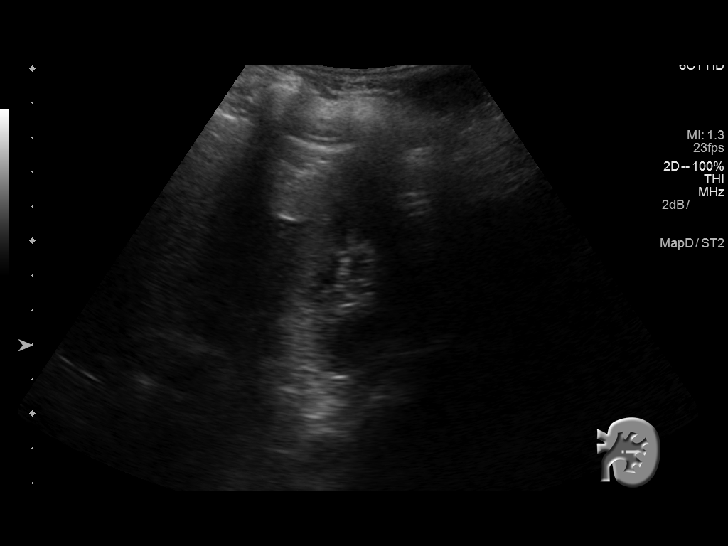
[im 83/91]
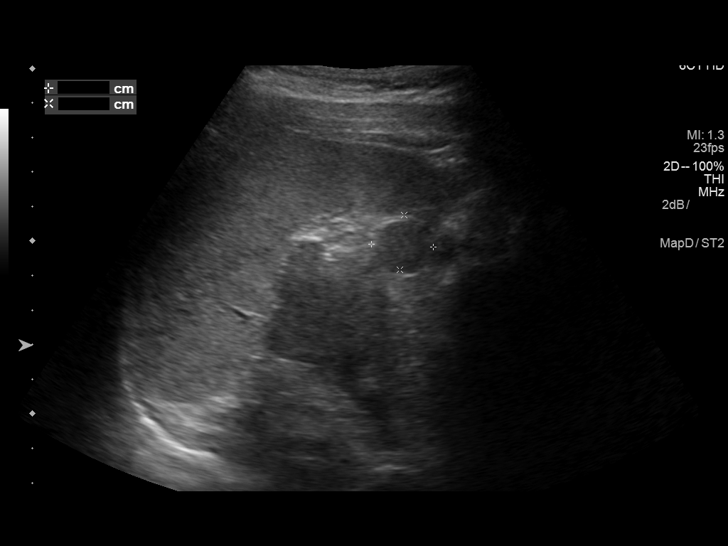
[im 91/91]
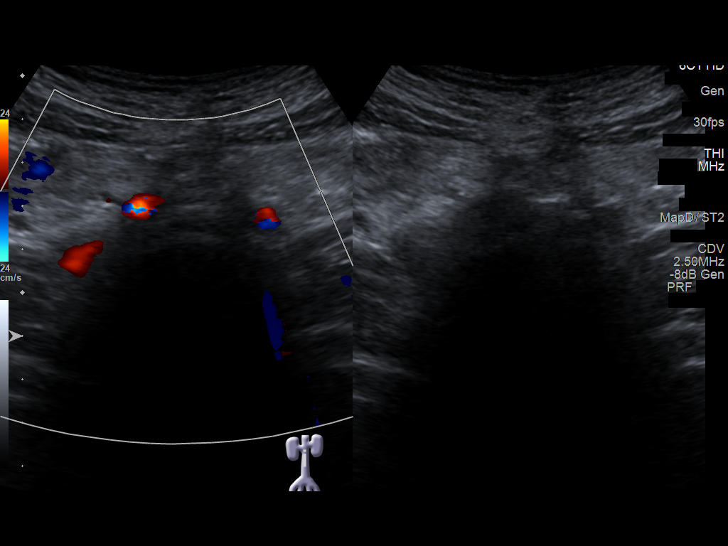

[14 of 25 positions shown; findings below may reference images not displayed]

FINDINGS: Gallbladder: No gallstones or wall thickening visualized. No
sonographic Murphy sign noted by sonographer.

Common bile duct: Diameter: 3 mm which is within normal limits.

Liver: No focal lesion identified. Within normal limits in
parenchymal echogenicity. Portal vein is patent on color Doppler
imaging with normal direction of blood flow towards the liver.

IVC: No abnormality visualized.

Pancreas: Visualized portion unremarkable.

Spleen: Size and appearance within normal limits.

Right Kidney: Length: 11.7 cm. Echogenicity within normal limits. No
mass or hydronephrosis visualized.

Left Kidney: Length: 11.1 cm. Echogenicity within normal limits. No
mass or hydronephrosis visualized.

Abdominal aorta: No aneurysm visualized.

Other findings: None.
IMPRESSION: No definite abnormality seen in the abdomen.

## 2019-10-06 ENCOUNTER — Telehealth: Payer: Self-pay | Admitting: Nurse Practitioner

## 2019-10-06 DIAGNOSIS — J019 Acute sinusitis, unspecified: Secondary | ICD-10-CM

## 2019-10-06 MED ORDER — AMOXICILLIN-POT CLAVULANATE 875-125 MG PO TABS: 1.0000 | ORAL_TABLET | Freq: Two times a day (BID) | ORAL | 0 refills | Status: DC

## 2019-10-06 NOTE — Progress Notes (Signed)

## 2019-11-05 ENCOUNTER — Telehealth: Payer: Self-pay | Admitting: Physician Assistant

## 2019-11-05 DIAGNOSIS — R059 Cough, unspecified: Secondary | ICD-10-CM

## 2019-11-05 MED ORDER — AZITHROMYCIN 250 MG PO TABS: ORAL_TABLET | ORAL | 0 refills | Status: DC

## 2019-11-05 MED ORDER — PROAIR RESPICLICK 108 (90 BASE) MCG/ACT IN AEPB: 2.0000 | INHALATION_SPRAY | Freq: Four times a day (QID) | RESPIRATORY_TRACT | 0 refills | Status: DC | PRN

## 2019-11-05 MED ORDER — PREDNISONE 20 MG PO TABS: 40.0000 mg | ORAL_TABLET | Freq: Every day | ORAL | 0 refills | Status: DC

## 2019-11-05 NOTE — Progress Notes (Signed)
We are sorry that you are not feeling well.  Here is how we plan to help!  Based on your presentation I believe you most likely have A cough due to bacteria.  When patients have a fever and a productive cough with a change in color or increased sputum production, we are concerned about bacterial bronchitis.  If left untreated it can progress to pneumonia.  If your symptoms do not improve with your treatment plan it is important that you contact your provider.   I have prescribed Azithromyin 250 mg: two tablets now and then one tablet daily for 4 additonal days    In addition you may use Prednisone 20 mg twice daily x 5 days (this will also help with wheezing and your ear pain) Albuterol inhaler as needed  From your responses in the eVisit questionnaire you describe inflammation in the upper respiratory tract which is causing a significant cough.  This is commonly called Bronchitis and has four common causes:    Allergies  Viral Infections  Acid Reflux  Bacterial Infection Allergies, viruses and acid reflux are treated by controlling symptoms or eliminating the cause. An example might be a cough caused by taking certain blood pressure medications. You stop the cough by changing the medication. Another example might be a cough caused by acid reflux. Controlling the reflux helps control the cough.  USE OF BRONCHODILATOR ("RESCUE") INHALERS: There is a risk from using your bronchodilator too frequently.  The risk is that over-reliance on a medication which only relaxes the muscles surrounding the breathing tubes can reduce the effectiveness of medications prescribed to reduce swelling and congestion of the tubes themselves.  Although you feel brief relief from the bronchodilator inhaler, your asthma may actually be worsening with the tubes becoming more swollen and filled with mucus.  This can delay other crucial treatments, such as oral steroid medications. If you need to use a bronchodilator  inhaler daily, several times per day, you should discuss this with your provider.  There are probably better treatments that could be used to keep your asthma under control.     HOME CARE . Only take medications as instructed by your medical team. . Complete the entire course of an antibiotic. . Drink plenty of fluids and get plenty of rest. . Avoid close contacts especially the very young and the elderly . Cover your mouth if you cough or cough into your sleeve. . Always remember to wash your hands . A steam or ultrasonic humidifier can help congestion.   GET HELP RIGHT AWAY IF: . You develop worsening fever. . You become short of breath . You cough up blood. . Your symptoms persist after you have completed your treatment plan MAKE SURE YOU   Understand these instructions.  Will watch your condition.  Will get help right away if you are not doing well or get worse.  Your e-visit answers were reviewed by a board certified advanced clinical practitioner to complete your personal care plan.  Depending on the condition, your plan could have included both over the counter or prescription medications. If there is a problem please reply  once you have received a response from your provider. Your safety is important to [place].  If you have drug allergies check your prescription carefully.    You can use MyChart to ask questions about today's visit, request a non-urgent call back, or ask for a work or school excuse for 24 hours related to this e-Visit. If it has been  greater than 24 hours you will need to follow up with your provider, or enter a new e-Visit to address those concerns. You will get an e-mail in the next two days asking about your experience.  I hope that your e-visit has been valuable and will speed your recovery. Thank you for using e-visits.  Greater than 5 minutes, yet less than 10 minutes of time have been spent researching, coordinating, and implementing care for this patient  today.  Jarold Motto PA-C

## 2019-11-10 MED ORDER — ALBUTEROL SULFATE HFA 108 (90 BASE) MCG/ACT IN AERS: 2.0000 | INHALATION_SPRAY | Freq: Four times a day (QID) | RESPIRATORY_TRACT | 0 refills | Status: DC | PRN

## 2019-11-10 NOTE — Addendum Note (Signed)
Addended by: Bennie Pierini on: 11/10/2019 05:09 PM   Modules accepted: Orders

## 2020-01-28 ENCOUNTER — Telehealth: Payer: Self-pay | Admitting: Emergency Medicine

## 2020-01-28 DIAGNOSIS — J329 Chronic sinusitis, unspecified: Secondary | ICD-10-CM

## 2020-01-28 MED ORDER — AMOXICILLIN-POT CLAVULANATE 875-125 MG PO TABS: 1.0000 | ORAL_TABLET | Freq: Two times a day (BID) | ORAL | 0 refills | Status: DC

## 2020-01-28 NOTE — Progress Notes (Signed)

## 2020-02-13 ENCOUNTER — Telehealth: Payer: Self-pay | Admitting: Physician Assistant

## 2020-02-13 DIAGNOSIS — J329 Chronic sinusitis, unspecified: Secondary | ICD-10-CM

## 2020-02-13 DIAGNOSIS — J4521 Mild intermittent asthma with (acute) exacerbation: Secondary | ICD-10-CM

## 2020-02-13 NOTE — Progress Notes (Signed)
Based on what you shared with me, I feel your condition warrants further evaluation and I recommend that you be seen for a face to face office visit. Giving ongoing sinusitis despite treatment with antibiotics, and increased in respiratory symptoms you need an in-person evaluation with examination and potential imaging to make sure most appropriate treatment has been given.    NOTE: If you entered your credit card information for this eVisit, you will not be charged. You may see a "hold" on your card for the $35 but that hold will drop off and you will not have a charge processed.   If you are having a true medical emergency please call 911.      For an urgent face to face visit, [place] has five urgent care centers for your convenience:     [facility] at [facility] Directions [phone] [address] [place], [place] [address] . 10 am - 6pm Monday - Friday    [facility]) Get Driving Directions [phone] [address] [place], [place] [address] . 10 am to 8 pm Monday-Friday . 12 pm to 8 pm [facility] at [facility] Get Driving Directions [phone] [address] [place], [place] [address] . 8 am to 8 pm Monday-Friday . 9 am to 6 pm Saturday . 11 am to 6 pm Sunday     [facility] at [facility] Get Driving Directions  [phone] [address] [place], [place] [address] . 8 am to 8 pm Monday-Friday . 8 am to 4 pm [facility] at [facility] Directions [phone] [address] [place], [place] [address] . 12 pm to 6 pm Monday-Friday      Your e-visit answers were reviewed by a board certified advanced clinical practitioner to complete your personal care plan.  Thank you for using e-Visits.

## 2020-02-22 ENCOUNTER — Telehealth: Payer: Self-pay | Admitting: Emergency Medicine

## 2020-02-22 DIAGNOSIS — J45909 Unspecified asthma, uncomplicated: Secondary | ICD-10-CM

## 2020-02-22 MED ORDER — ALBUTEROL SULFATE HFA 108 (90 BASE) MCG/ACT IN AERS
2.0000 | INHALATION_SPRAY | Freq: Four times a day (QID) | RESPIRATORY_TRACT | 0 refills | Status: DC | PRN
Start: 2020-02-22 — End: 2020-03-15

## 2020-02-22 NOTE — Progress Notes (Signed)
Visit for Asthma  Based on what you have shared with me, it looks like you may have a flare up of your asthma.  Asthma is a chronic (ongoing) lung disease which results in airway obstruction, inflammation and hyper-responsiveness.   Asthma symptoms vary from person to person, with common symptoms including nighttime awakening and decreased ability to participate in normal activities as a result of shortness of breath. It is often triggered by changes in weather, changes in the season, changes in air temperature, or inside (home, school, daycare or work) allergens such as animal dander, mold, mildew, woodstoves or cockroaches.   It can also be triggered by hormonal changes, extreme emotion, physical exertion or an upper respiratory tract illness.     It is important to identify the trigger, and then eliminate or avoid the trigger if possible.   If you have been prescribed medications to be taken on a regular basis, it is important to follow the asthma action plan and to follow guidelines to adjust medication in response to increasing symptoms of decreased peak expiratory flow rate  Treatment: I have prescribed: Albuterol (Proventil HFA; Ventolin HFA) 108 (90 Base) MCG/ACT Inhaler 2 puffs into the lungs every six hours as needed for wheezing or shortness of breath  HOME CARE . Only take medications as instructed by your medical team. . Consider wearing a mask or scarf to improve breathing air temperature have been shown to decrease irritation and decrease exacerbations . Get rest. . Taking a steamy shower or using a humidifier may help nasal congestion sand ease sore throat pain. You can place a towel over your head and breathe in the steam from hot water coming from a faucet. . Using a saline nasal spray works much the same way.  . Cough drops, hare candies and sore throat lozenges may  ease your cough.  . Avoid close contacts especially the very you and the elderly . Cover your mouth if you cough or sneeze . Always remember to wash your hands.    GET HELP RIGHT AWAY IF: . You develop worsening symptoms; breathlessness at rest, drowsy, confused or agitated, unable to speak in full sentences . You have coughing fits . You develop a severe headache or visual changes . You develop shortness of breath, difficulty breathing or start having chest pain . Your symptoms persist after you have completed your treatment plan . If your symptoms do not improve within 10 days  MAKE SURE YOU . Understand these instructions. . Will watch your condition. . Will get help right away if you are not doing well or get worse.   Your e-visit answers were reviewed by a board certified advanced clinical practitioner to complete your personal care plan, Depending upon the condition, your plan could have included both over the counter or prescription medications.  Please review your pharmacy choice. Your safety is important to us. If you have drug allergies check your prescription carefully. You can use MyChart to ask questions about today's visit, request a non-urgent call back, or ask for a work or school excuse for 24 hours related to this e-Visit. If it has been greater than 24 hours you will need to follow up with your provider, or enter a new e-Visit to address those concerns.  You will get an e-mail in the next two days asking about your experience. I hope that your e-visit has been valuable and will speed your recovery. Thank you for using e-visits.   Approximately 5 minutes was used   in reviewing the patient's chart, questionnaire, prescribing medications, and documentation.  

## 2020-03-15 ENCOUNTER — Telehealth: Payer: Self-pay | Admitting: Emergency Medicine

## 2020-03-15 DIAGNOSIS — R062 Wheezing: Secondary | ICD-10-CM

## 2020-03-15 MED ORDER — AZITHROMYCIN 250 MG PO TABS
ORAL_TABLET | ORAL | 0 refills | Status: DC
Start: 2020-03-15 — End: 2020-07-18

## 2020-03-15 MED ORDER — ALBUTEROL SULFATE HFA 108 (90 BASE) MCG/ACT IN AERS: 2.0000 | INHALATION_SPRAY | RESPIRATORY_TRACT | 0 refills | Status: DC | PRN

## 2020-03-15 NOTE — Progress Notes (Signed)
Visit for Asthma  Based on what you have shared with me, it looks like you may have a flare up of your asthma.  Asthma is a chronic (ongoing) lung disease which results in airway obstruction, inflammation and hyper-responsiveness.   Asthma symptoms vary from person to person, with common symptoms including nighttime awakening and decreased ability to participate in normal activities as a result of shortness of breath. It is often triggered by changes in weather, changes in the season, changes in air temperature, or inside (home, school, daycare or work) allergens such as animal dander, mold, mildew, woodstoves or cockroaches.   It can also be triggered by hormonal changes, extreme emotion, physical exertion or an upper respiratory tract illness.     It is important to identify the trigger, and then eliminate or avoid the trigger if possible.   If you have been prescribed medications to be taken on a regular basis, it is important to follow the asthma action plan and to follow guidelines to adjust medication in response to increasing symptoms of decreased peak expiratory flow rate  Treatment: I have prescribed: Albuterol (Proventil HFA; Ventolin HFA) 108 (90 Base) MCG/ACT Inhaler 2 puffs into the lungs every six hours as needed for wheezing or shortness of breath   Based on the length of symptoms and congestion with ear fullness, lets start you on an antibiotic as well.  HOME CARE . Only take medications as instructed by your medical team. . Consider wearing a mask or scarf to improve breathing air temperature have been shown to decrease irritation and decrease exacerbations . Get rest. . Taking a steamy shower or using a humidifier may help nasal congestion sand ease sore throat pain. You can place a towel over your head and breathe in the steam from hot water coming from a  faucet. . Using a saline nasal spray works much the same way.  . Cough drops, hare candies and sore throat lozenges may ease your cough.  . Avoid close contacts especially the very you and the elderly . Cover your mouth if you cough or sneeze . Always remember to wash your hands.    GET HELP RIGHT AWAY IF: . You develop worsening symptoms; breathlessness at rest, drowsy, confused or agitated, unable to speak in full sentences . You have coughing fits . You develop a severe headache or visual changes . You develop shortness of breath, difficulty breathing or start having chest pain . Your symptoms persist after you have completed your treatment plan . If your symptoms do not improve within 10 days  MAKE SURE YOU . Understand these instructions. . Will watch your condition. . Will get help right away if you are not doing well or get worse.   Your e-visit answers were reviewed by a board certified advanced clinical practitioner to complete your personal care plan, Depending upon the condition, your plan could have included both over the counter or prescription medications.  Please review your pharmacy choice. Your safety is important to [place]. If you have drug allergies check your prescription carefully. You can use MyChart to ask questions about today's visit, request a non-urgent call back, or ask for a work or school excuse for 24 hours related to this e-Visit. If it has been greater than 24 hours you will need to follow up with your provider, or enter a new e-Visit to address those concerns.  You will get an e-mail in the next two days asking about your experience. I hope that your  e-visit has been valuable and will speed your recovery. Thank you for using e-visits.  Approximately 5 minutes was used in reviewing the patient's chart, questionnaire, prescribing medications, and documentation.

## 2020-04-10 ENCOUNTER — Telehealth: Payer: Self-pay | Admitting: Nurse Practitioner

## 2020-04-10 DIAGNOSIS — R112 Nausea with vomiting, unspecified: Secondary | ICD-10-CM

## 2020-04-10 MED ORDER — ONDANSETRON 4 MG PO TBDP: 4.0000 mg | ORAL_TABLET | Freq: Three times a day (TID) | ORAL | 0 refills | Status: AC | PRN

## 2020-04-10 NOTE — Progress Notes (Signed)
We are sorry that you are not feeling well. Here is how we plan to help!  Based on what you have shared with me it looks like you have a Virus that is irritating your GI tract.  Vomiting is the forceful emptying of a portion of the stomach's content through the mouth.  Although nausea and vomiting can make you feel miserable, it's important to remember that these are not diseases, but rather symptoms of an underlying illness.  When we treat short term symptoms, we always caution that any symptoms that persist should be fully evaluated in a medical office.  I have prescribed a medication that will help alleviate your symptoms and allow you to stay hydrated:  Zofran 4 mg 1 tablet every 8 hours as needed for nausea and vomiting  HOME CARE:  Drink clear liquids.  This is very important! Dehydration (the lack of fluid) can lead to a serious complication.  Start off with 1 tablespoon every 5 minutes for 8 hours.  You may begin eating bland foods after 8 hours without vomiting.  Start with saltine crackers, white bread, rice, mashed potatoes, applesauce.  After 48 hours on a bland diet, you may resume a normal diet.  Try to go to sleep.  Sleep often empties the stomach and relieves the need to vomit.  GET HELP RIGHT AWAY IF:   Your symptoms do not improve or worsen within 2 days after treatment.  You have a fever for over 3 days.  You cannot keep down fluids after trying the medication.  MAKE SURE YOU:   Understand these instructions.  Will watch your condition.  Will get help right away if you are not doing well or get worse.   Thank you for choosing an e-visit. Your e-visit answers were reviewed by a board certified advanced clinical practitioner to complete your personal care plan. Depending upon the condition, your plan could have included both over the counter or prescription medications. Please review your pharmacy choice. Be sure that the pharmacy you have chosen is open so  that you can pick up your prescription now.  If there is a problem you may message your provider in MyChart to have the prescription routed to another pharmacy. Your safety is important to us. If you have drug allergies check your prescription carefully.  For the next 24 hours, you can use MyChart to ask questions about today's visit, request a non-urgent call back, or ask for a work or school excuse from your e-visit provider. You will get an e-mail in the next two days asking about your experience. I hope that your e-visit has been valuable and will speed your recovery.  I have spent at least 5 minutes reviewing and documenting in the patient's chart.   

## 2020-05-21 ENCOUNTER — Telehealth: Payer: Self-pay | Admitting: Orthopedic Surgery

## 2020-05-21 DIAGNOSIS — J4520 Mild intermittent asthma, uncomplicated: Secondary | ICD-10-CM

## 2020-05-21 MED ORDER — ALBUTEROL SULFATE HFA 108 (90 BASE) MCG/ACT IN AERS
2.0000 | INHALATION_SPRAY | RESPIRATORY_TRACT | 0 refills | Status: DC | PRN
Start: 2020-05-21 — End: 2020-07-18

## 2020-05-21 NOTE — Progress Notes (Signed)
Visit for Asthma  Based on what you have shared with me, it looks like you may have a flare up of your asthma.  Asthma is a chronic (ongoing) lung disease which results in airway obstruction, inflammation and hyper-responsiveness.   Asthma symptoms vary from person to person, with common symptoms including nighttime awakening and decreased ability to participate in normal activities as a result of shortness of breath. It is often triggered by changes in weather, changes in the season, changes in air temperature, or inside (home, school, daycare or work) allergens such as animal dander, mold, mildew, woodstoves or cockroaches.   It can also be triggered by hormonal changes, extreme emotion, physical exertion or an upper respiratory tract illness.     It is important to identify the trigger, and then eliminate or avoid the trigger if possible.   If you have been prescribed medications to be taken on a regular basis, it is important to follow the asthma action plan and to follow guidelines to adjust medication in response to increasing symptoms of decreased peak expiratory flow rate  Treatment: I have prescribed: Albuterol (Proventil HFA; Ventolin HFA) 108 (90 Base) MCG/ACT Inhaler 2 puffs into the lungs every six hours as needed for wheezing or shortness of breath  HOME CARE . Only take medications as instructed by your medical team. . Consider wearing a mask or scarf to improve breathing air temperature have been shown to decrease irritation and decrease exacerbations . Get rest. . Taking a steamy shower or using a humidifier may help nasal congestion sand ease sore throat pain. You can place a towel over your head and breathe in the steam from hot water coming from a faucet. . Using a saline nasal spray works much the same way.  . Cough drops, hare candies and sore throat lozenges may  ease your cough.  . Avoid close contacts especially the very you and the elderly . Cover your mouth if you cough or sneeze . Always remember to wash your hands.    GET HELP RIGHT AWAY IF: . You develop worsening symptoms; breathlessness at rest, drowsy, confused or agitated, unable to speak in full sentences . You have coughing fits . You develop a severe headache or visual changes . You develop shortness of breath, difficulty breathing or start having chest pain . Your symptoms persist after you have completed your treatment plan . If your symptoms do not improve within 10 days  MAKE SURE YOU . Understand these instructions. . Will watch your condition. . Will get help right away if you are not doing well or get worse.   Your e-visit answers were reviewed by a board certified advanced clinical practitioner to complete your personal care plan, Depending upon the condition, your plan could have included both over the counter or prescription medications.  Please review your pharmacy choice. Your safety is important to [place]. If you have drug allergies check your prescription carefully. You can use MyChart to ask questions about today's visit, request a non-urgent call back, or ask for a work or school excuse for 24 hours related to this e-Visit. If it has been greater than 24 hours you will need to follow up with your provider, or enter a new e-Visit to address those concerns.  You will get an e-mail in the next two days asking about your experience. I hope that your e-visit has been valuable and will speed your recovery. Thank you for using e-visits.  Greater than 5 minutes, yet less  than 10 minutes of time have been spent researching, coordinating and implementing care for this patient today.

## 2020-06-10 ENCOUNTER — Telehealth: Payer: Self-pay | Admitting: Emergency Medicine

## 2020-06-10 ENCOUNTER — Telehealth: Payer: Self-pay | Admitting: Nurse Practitioner

## 2020-06-10 DIAGNOSIS — J069 Acute upper respiratory infection, unspecified: Secondary | ICD-10-CM

## 2020-06-10 NOTE — Progress Notes (Signed)
Based on what you shared with me, sudden onset of symptoms including need to use your inhaler and your boyfriend with similar symptoms, I feel your condition warrants further evaluation and I recommend that you be seen in a face to face office visit. It is important to be tested for COVID but to also rule out other causes of your symptoms.    NOTE: If you entered your credit card information for this eVisit, you will not be charged. You may see a "hold" on your card for the $35 but that hold will drop off and you will not have a charge processed.   If you are having a true medical emergency please call 911.      For an urgent face to face visit, [place] has six urgent care centers for your convenience:     [facility] at [facility] Directions [phone] [address] [place], [place] [address] . 8 am - 4 pm Monday - Friday    [facility] Ur[facility] Center [facility]) Get Driving Directions [phone] [address] [place], [place] [address] . 8 am to 8 pm Monday-Friday . 10 am to 6 pm [facility] Urgent [facility]) Get Driving Directions [phone]  [address] [place],  [place]  [address] . 8 am to 8 pm Monday-Friday . 8 am to 4 pm [facility] Urgent Care at [facility] Get Driving Directions [phone] [address] [place], [place] [address] . 8 am to 8 pm Monday-Friday . 8 am to 4 pm [facility] Urgent Care at [facility] Get Driving Directions  [phone] [address] [place], [place] [address] . 8 am to 8 pm Monday-Friday . 8 am to 4 pm [facility] Care at [facility] Directions [phone] [address], [address] [place], [place] [address] . 8 am to 8 pm Monday-Friday . 8 am to 4 pm Saturday-Sunday     Your MyChart E-visit  questionnaire answers were reviewed by a board certified advanced clinical practitioner to complete your personal care plan based on your specific symptoms.  Thank you for using e-Visits.    Approximately 5 minutes was spent documenting and reviewing patient's chart.

## 2020-06-10 NOTE — Progress Notes (Signed)
Based on what you shared with me from your Evisit earlier this morning:  "My chest is also very congested and sometimes I hit my inhaler just to feel like I can breathe."    I feel your condition warrants further evaluation and I recommend that you be seen in a face to face office visit for further evaluation and treatment.     NOTE: If you entered your credit card information for this eVisit, you will not be charged. You may see a "hold" on your card for the $35 but that hold will drop off and you will not have a charge processed.   If you are having a true medical emergency please call 911.      For an urgent face to face visit, [place] has six urgent care centers for your convenience:     [facility] at [facility] Directions [phone] [address] [place], [place] [address] . 8 am - 4 pm Monday - Friday    [facility] Urgent Care Center [facility]) Get Driving Directions [phone] [address] [place], [place] [address] . 8 am to 8 pm Monday-Friday . 10 am to 6 pm [facility] Urgent [facility]) Get Driving Directions [phone]  [address] [place],  [place]  [address] . 8 am to 8 pm Monday-Friday . 8 am to 4 pm [facility] Urgent Care at [facility] Get Driving Directions [phone] [address] [place], [place] [address] . 8 am to 8 pm Monday-Friday . 8 am to 4 pm [facility] Urgent Care at [facility] Get Driving Directions  [phone] [address] [place], [place] [address] . 8 am to 8 pm Monday-Friday . 8 am to 4 pm [facility] Urgent Care at [facility] Directions [phone] [address] [place], [place] [address] . 8 am to 8 pm Monday-Friday . 8 am to 4 pm Saturday-Sunday     Your MyChart E-visit questionnaire  answers were reviewed by a board certified advanced clinical practitioner to complete your personal care plan based on your specific symptoms.  Thank you for using e-Visits.    Approximately 5 minutes was spent documenting and reviewing patient's chart.

## 2020-06-10 NOTE — Progress Notes (Signed)
It appears that you did an evisit earlier today and the provider suggested a face to face visit for further evaluation. You have no fever and have only been sick for 3 days. You do  Not meet criteia for antibiotic at this time anyway. Please take suggestion made in previous evist .  If it is suggested that you do a face to face visit then you cannot do video visit. You must be seen in person.  However i will write you a work note for today.

## 2020-07-01 ENCOUNTER — Telehealth: Payer: Self-pay | Admitting: Emergency Medicine

## 2020-07-01 DIAGNOSIS — J019 Acute sinusitis, unspecified: Secondary | ICD-10-CM

## 2020-07-01 MED ORDER — FLUTICASONE PROPIONATE 50 MCG/ACT NA SUSP: 2.0000 | Freq: Every day | NASAL | 0 refills | Status: AC

## 2020-07-01 MED ORDER — AMOXICILLIN-POT CLAVULANATE 875-125 MG PO TABS: 1.0000 | ORAL_TABLET | Freq: Two times a day (BID) | ORAL | 0 refills | Status: DC

## 2020-07-01 NOTE — Progress Notes (Signed)
E-Visit for Sinus Problems  We are sorry that you are not feeling well.  Here is how we plan to help!  Based on what you have shared with me it looks like you have sinusitis.  Sinusitis is inflammation and infection in the sinus cavities of the head.  Based on your presentation I believe you most likely have Acute Bacterial Sinusitis.  This is an infection caused by bacteria and is treated with antibiotics. I have prescribed Augmentin 875mg /125mg  one tablet twice daily with food, for 7 days. I have also refilled your Flonase nasal spray to use 2 sprays per nostril once daily.  You may use an oral decongestant such as Mucinex D or if you have glaucoma or high blood pressure use plain Mucinex. Saline nasal spray help and can safely be used as often as needed for congestion.  If you develop worsening sinus pain, fever or notice severe headache and vision changes, or if symptoms are not better after completion of antibiotic, please schedule an appointment with a health care provider.    Sinus infections are not as easily transmitted as other respiratory infection, however we still recommend that you avoid close contact with loved ones, especially the very young and elderly.  Remember to wash your hands thoroughly throughout the day as this is the number one way to prevent the spread of infection!  Home Care: Only take medications as instructed by your medical team. Complete the entire course of an antibiotic. Do not take these medications with alcohol. A steam or ultrasonic humidifier can help congestion.  You can place a towel over your head and breathe in the steam from hot water coming from a faucet. Avoid close contacts especially the very young and the elderly. Cover your mouth when you cough or sneeze. Always remember to wash your hands.  Get Help Right Away If: You develop worsening fever or sinus pain. You develop a severe head ache or visual changes. Your symptoms persist after you have  completed your treatment plan.  Make sure you Understand these instructions. Will watch your condition. Will get help right away if you are not doing well or get worse.  Thank you for choosing an e-visit.  Your e-visit answers were reviewed by a board certified advanced clinical practitioner to complete your personal care plan. Depending upon the condition, your plan could have included both over the counter or prescription medications.  Please review your pharmacy choice. Make sure the pharmacy is open so you can pick up prescription now. If there is a problem, you may contact your provider through and have the prescription routed to another pharmacy.  Your safety is important to [employer]. If you have drug allergies check your prescription carefully.   For the next 24 hours you can use MyChart to ask questions about today's visit, request a non-urgent call back, or ask for a work or school excuse. You will get an email in the next two days asking about your experience. I hope that your e-visit has been valuable and will speed your recovery.  Approximately 5 minutes was spent documenting and reviewing patient's chart.

## 2020-07-18 ENCOUNTER — Telehealth: Payer: Self-pay | Admitting: Nurse Practitioner

## 2020-07-18 DIAGNOSIS — J4531 Mild persistent asthma with (acute) exacerbation: Secondary | ICD-10-CM

## 2020-07-18 MED ORDER — ALBUTEROL SULFATE HFA 108 (90 BASE) MCG/ACT IN AERS
2.0000 | INHALATION_SPRAY | RESPIRATORY_TRACT | 0 refills | Status: DC | PRN
Start: 2020-07-18 — End: 2020-08-06

## 2020-07-18 NOTE — Progress Notes (Signed)
Visit for Asthma  Based on what you have shared with me, it looks like you may have a flare up of your asthma.  Asthma is a chronic (ongoing) lung disease which results in airway obstruction, inflammation and hyper-responsiveness.   Asthma symptoms vary from person to person, with common symptoms including nighttime awakening and decreased ability to participate in normal activities as a result of shortness of breath. It is often triggered by changes in weather, changes in the season, changes in air temperature, or inside (home, school, daycare or work) allergens such as animal dander, mold, mildew, woodstoves or cockroaches.   It can also be triggered by hormonal changes, extreme emotion, physical exertion or an upper respiratory tract illness.     It is important to identify the trigger, and then eliminate or avoid the trigger if possible.   If you have been prescribed medications to be taken on a regular basis, it is important to follow the asthma action plan and to follow guidelines to adjust medication in response to increasing symptoms of decreased peak expiratory flow rate  Treatment: I have prescribed: Albuterol (Proventil HFA; Ventolin HFA) 108 (90 Base) MCG/ACT Inhaler 2 puffs into the lungs every six hours as needed for wheezing or shortness of breath  HOME CARE Only take medications as instructed by your medical team. Consider wearing a mask or scarf to improve breathing air temperature have been shown to decrease irritation and decrease exacerbations Get rest. Taking a steamy shower or using a humidifier may help nasal congestion sand ease sore throat pain. You can place a towel over your head and breathe in the steam from hot water coming from a faucet. Using a saline nasal spray works much the same way.  Cough drops, hare candies and sore throat lozenges may ease your  cough.  Avoid close contacts especially the very you and the elderly Cover your mouth if you cough or sneeze Always remember to wash your hands.    GET HELP RIGHT AWAY IF: You develop worsening symptoms; breathlessness at rest, drowsy, confused or agitated, unable to speak in full sentences You have coughing fits You develop a severe headache or visual changes You develop shortness of breath, difficulty breathing or start having chest pain Your symptoms persist after you have completed your treatment plan If your symptoms do not improve within 10 days  MAKE SURE YOU Understand these instructions. Will watch your condition. Will get help right away if you are not doing well or get worse.   Your e-visit answers were reviewed by a board certified advanced clinical practitioner to complete your personal care plan, Depending upon the condition, your plan could have included both over the counter or prescription medications.   Please review your pharmacy choice. Your safety is important to [place]. If you have drug allergies check your prescription carefully.  You can use MyChart to ask questions about today's visit, request a non-urgent  call back, or ask for a work or school excuse for 24 hours related to this e-Visit. If it has been greater than 24 hours you will need to follow up with your provider, or enter a new e-Visit to address those concerns.   You will get an e-mail in the next two days asking about your experience. I hope that your e-visit has been valuable and will speed your recovery. Thank you for using e-visits.   Meds ordered this encounter  Medications   albuterol (VENTOLIN HFA) 108 (90 Base) MCG/ACT inhaler  Sig: Inhale 2 puffs into the lungs every 4 (four) hours as needed for wheezing or shortness of breath (as needed every 4-6 hours. Rinse mouth after use).    Dispense:  8 g    Refill:  0     I spent approximately 7 minutes reviewing the patient's history, current  symptoms and coordinating their plan of care today.

## 2020-08-06 ENCOUNTER — Telehealth: Payer: Self-pay | Admitting: Nurse Practitioner

## 2020-08-06 DIAGNOSIS — J4521 Mild intermittent asthma with (acute) exacerbation: Secondary | ICD-10-CM

## 2020-08-06 MED ORDER — ALBUTEROL SULFATE HFA 108 (90 BASE) MCG/ACT IN AERS
1.0000 | INHALATION_SPRAY | RESPIRATORY_TRACT | 0 refills | Status: DC | PRN
Start: 2020-08-06 — End: 2020-09-22

## 2020-08-06 NOTE — Progress Notes (Signed)
Visit for Asthma  Based on what you have shared with me, it looks like you may have a flare up of your asthma.  Asthma is a chronic (ongoing) lung disease which results in airway obstruction, inflammation and hyper-responsiveness.   Asthma symptoms vary from person to person, with common symptoms including nighttime awakening and decreased ability to participate in normal activities as a result of shortness of breath. It is often triggered by changes in weather, changes in the season, changes in air temperature, or inside (home, school, daycare or work) allergens such as animal dander, mold, mildew, woodstoves or cockroaches.   It can also be triggered by hormonal changes, extreme emotion, physical exertion or an upper respiratory tract illness.     It is important to identify the trigger, and then eliminate or avoid the trigger if possible.   If you have been prescribed medications to be taken on a regular basis, it is important to follow the asthma action plan and to follow guidelines to adjust medication in response to increasing symptoms of decreased peak expiratory flow rate  Treatment: I have prescribed: Albuterol (Proventil HFA; Ventolin HFA) 108 (90 Base) MCG/ACT Inhaler 2 puffs into the lungs every four-six hours as needed for wheezing or shortness of breath  HOME CARE Only take medications as instructed by your medical team. Consider wearing a mask or scarf to improve breathing air temperature have been shown to decrease irritation and decrease exacerbations Get rest. Taking a steamy shower or using a humidifier may help nasal congestion sand ease sore throat pain. You can place a towel over your head and breathe in the steam from hot water coming from a faucet. Using a saline nasal spray works much the same way.  Cough drops, hare candies and sore throat lozenges may ease your  cough.  Avoid close contacts especially the very you and the elderly Cover your mouth if you cough or sneeze Always remember to wash your hands.    GET HELP RIGHT AWAY IF: You develop worsening symptoms; breathlessness at rest, drowsy, confused or agitated, unable to speak in full sentences You have coughing fits You develop a severe headache or visual changes You develop shortness of breath, difficulty breathing or start having chest pain Your symptoms persist after you have completed your treatment plan If your symptoms do not improve within 10 days  MAKE SURE YOU Understand these instructions. Will watch your condition. Will get help right away if you are not doing well or get worse.   Your e-visit answers were reviewed by a board certified advanced clinical practitioner to complete your personal care plan, Depending upon the condition, your plan could have included both over the counter or prescription medications.   Please review your pharmacy choice. Your safety is important to [place]. If you have drug allergies check your prescription carefully.  You can use MyChart to ask questions about today's visit, request a non-urgent  call back, or ask for a work or school excuse for 24 hours related to this e-Visit. If it has been greater than 24 hours you will need to follow up with your provider, or enter a new e-Visit to address those concerns.   You will get an e-mail in the next two days asking about your experience. I hope that your e-visit has been valuable and will speed your recovery. Thank you for using e-visits.   I spent approximately 7 minutes reviewing the patient's history, current symptoms and coordinating their plan of care today.  Meds ordered this encounter  Medications   albuterol (VENTOLIN HFA) 108 (90 Base) MCG/ACT inhaler    Sig: Inhale 1-2 puffs into the lungs every 4 (four) hours as needed for wheezing or shortness of breath (every 4-6 hours as needed rinse  mouth after use).    Dispense:  8 g    Refill:  0    Dispense with spacer if available

## 2020-09-21 ENCOUNTER — Telehealth: Payer: Self-pay | Admitting: Physician Assistant

## 2020-09-21 ENCOUNTER — Other Ambulatory Visit (INDEPENDENT_AMBULATORY_CARE_PROVIDER_SITE_OTHER): Payer: Self-pay | Admitting: Nurse Practitioner

## 2020-09-21 DIAGNOSIS — J4521 Mild intermittent asthma with (acute) exacerbation: Secondary | ICD-10-CM

## 2020-09-21 NOTE — Progress Notes (Signed)
Visit for Asthma  Based on what you have shared with me, it looks like you may have a flare up of your asthma.  Asthma is a chronic (ongoing) lung disease which results in airway obstruction, inflammation and hyper-responsiveness.   I see you have had several E-visits in the last year for similar symptoms and been given a prescription for an albuterol inhaler.  I think your asthma needs to be evaluated in person by a primary care provider who can help you better manage your asthma.  Using an albuterol inhaler for your asthma more than twice a week is a sign you need increased therapy to help prevent asthma symptoms from happening in the first place. If you do not have a primary care provider, you can try to find one by going to [email or web address], clicking on "primary care" and clicking on "new patients: schedule online" and choosing an appointment place and time that fits your schedule.   Asthma symptoms vary from person to person, with common symptoms including nighttime awakening and decreased ability to participate in normal activities as a result of shortness of breath. It is often triggered by changes in weather, changes in the season, changes in air temperature, or inside (home, school, daycare or work) allergens such as animal dander, mold, mildew, woodstoves or cockroaches.   It can also be triggered by hormonal changes, extreme emotion, physical exertion or an upper respiratory tract illness.     It is important to identify the trigger, and then eliminate or avoid the trigger if possible.   If you have been prescribed medications to be taken on a regular basis, it is important to follow the asthma action plan and to follow guidelines to adjust medication in response to increasing symptoms of decreased peak expiratory flow rate  Treatment: I have prescribed: Albuterol (Proventil HFA;  Ventolin HFA) 108 (90 Base) MCG/ACT Inhaler 2 puffs into the lungs every six hours as needed for wheezing or shortness of breath  HOME CARE Only take medications as instructed by your medical team. Consider wearing a mask or scarf to improve breathing air temperature have been shown to decrease irritation and decrease exacerbations Get rest. Taking a steamy shower or using a humidifier may help nasal congestion sand ease sore throat pain. You can place a towel over your head and breathe in the steam from hot water coming from a faucet. Using a saline nasal spray works much the same way.  Cough drops, hare candies and sore throat lozenges may ease your cough.  Avoid close contacts especially the very you and the elderly Cover your mouth if you cough or sneeze Always remember to wash your hands.    GET HELP RIGHT AWAY IF: You develop worsening symptoms; breathlessness at rest, drowsy, confused or agitated, unable to speak in full sentences You have coughing fits You develop a severe headache or visual changes You develop shortness of breath, difficulty breathing or start having chest pain Your symptoms persist after you have completed your treatment plan If your symptoms do not improve within 10 days  MAKE SURE YOU Understand these instructions. Will watch your condition. Will get help right away if you are not doing well or get worse.   Your e-visit answers were reviewed by a board certified advanced clinical practitioner to complete your personal care plan, Depending upon the condition, your plan could have included both over the counter or prescription medications.   Please review your pharmacy choice. Your safety is important to [place]. If  you have drug allergies check your prescription carefully.  You can use MyChart to ask questions about today's visit, request a non-urgent  call back, or ask for a work or school excuse for 24 hours related to this e-Visit. If it has been greater  than 24 hours you will need to follow up with your provider, or enter a new e-Visit to address those concerns.   You will get an e-mail in the next two days asking about your experience. I hope that your e-visit has been valuable and will speed your recovery. Thank you for using e-visits.  I have spent 5 minutes in review of e-visit questionnaire, review and updating patient chart, medical decision making and response to patient.   Rica Mast, PhD, FNP-BC

## 2020-09-22 MED ORDER — ALBUTEROL SULFATE HFA 108 (90 BASE) MCG/ACT IN AERS: 1.0000 | INHALATION_SPRAY | RESPIRATORY_TRACT | 0 refills | Status: DC | PRN

## 2020-10-04 ENCOUNTER — Telehealth: Payer: Self-pay | Admitting: Physician Assistant

## 2020-10-04 DIAGNOSIS — J019 Acute sinusitis, unspecified: Secondary | ICD-10-CM

## 2020-10-04 DIAGNOSIS — J4531 Mild persistent asthma with (acute) exacerbation: Secondary | ICD-10-CM

## 2020-10-04 DIAGNOSIS — B9689 Other specified bacterial agents as the cause of diseases classified elsewhere: Secondary | ICD-10-CM

## 2020-10-04 MED ORDER — PREDNISONE 20 MG PO TABS: 20.0000 mg | ORAL_TABLET | Freq: Every day | ORAL | 0 refills | Status: DC

## 2020-10-04 MED ORDER — AMOXICILLIN-POT CLAVULANATE 875-125 MG PO TABS: 1.0000 | ORAL_TABLET | Freq: Two times a day (BID) | ORAL | 0 refills | Status: DC

## 2020-10-04 NOTE — Progress Notes (Signed)
E-Visit for Sinus Problems  We are sorry that you are not feeling well.  Here is how we plan to help!  Based on what you have shared with me it looks like you have sinusitis.  Sinusitis is inflammation and infection in the sinus cavities of the head.  Based on your presentation I believe you most likely have Acute Bacterial Sinusitis.  This is an infection caused by bacteria and is treated with antibiotics. I have prescribed Augmentin 875mg /125mg  one tablet twice daily with food, for 7 days. Prednisone 20mg  daily for 7 days. You may use an oral decongestant such as Mucinex D or if you have glaucoma or high blood pressure use plain Mucinex. Saline nasal spray help and can safely be used as often as needed for congestion.  If you develop worsening sinus pain, fever or notice severe headache and vision changes, or if symptoms are not better after completion of antibiotic, please schedule an appointment with a health care provider.    Sinus infections are not as easily transmitted as other respiratory infection, however we still recommend that you avoid close contact with loved ones, especially the very young and elderly.  Remember to wash your hands thoroughly throughout the day as this is the number one way to prevent the spread of infection!  Home Care: Only take medications as instructed by your medical team. Complete the entire course of an antibiotic. Do not take these medications with alcohol. A steam or ultrasonic humidifier can help congestion.  You can place a towel over your head and breathe in the steam from hot water coming from a faucet. Avoid close contacts especially the very young and the elderly. Cover your mouth when you cough or sneeze. Always remember to wash your hands.  Get Help Right Away If: You develop worsening fever or sinus pain. You develop a severe head ache or visual changes. Your symptoms persist after you have completed your treatment plan.  Make sure  you Understand these instructions. Will watch your condition. Will get help right away if you are not doing well or get worse.  Thank you for choosing an e-visit.  Your e-visit answers were reviewed by a board certified advanced clinical practitioner to complete your personal care plan. Depending upon the condition, your plan could have included both over the counter or prescription medications.  Please review your pharmacy choice. Make sure the pharmacy is open so you can pick up prescription now. If there is a problem, you may contact your provider through and have the prescription routed to another pharmacy.  Your safety is important to . If you have drug allergies check your prescription carefully.   For the next 24 hours you can use MyChart to ask questions about today's visit, request a non-urgent call back, or ask for a work or school excuse. You will get an email in the next two days asking about your experience. I hope that your e-visit has been valuable and will speed your recovery.  I provided 6 minutes of non face-to-face time during this encounter for chart review and documentation.

## 2020-10-20 ENCOUNTER — Inpatient Hospital Stay: Payer: Medicaid Other | Attending: Genetic Counselor | Admitting: Genetic Counselor

## 2020-10-20 DIAGNOSIS — Z8481 Family history of carrier of genetic disease: Secondary | ICD-10-CM

## 2020-10-20 DIAGNOSIS — Z803 Family history of malignant neoplasm of breast: Secondary | ICD-10-CM

## 2020-10-24 ENCOUNTER — Encounter: Payer: Self-pay | Admitting: Genetic Counselor

## 2020-10-24 DIAGNOSIS — Z8481 Family history of carrier of genetic disease: Secondary | ICD-10-CM | POA: Insufficient documentation

## 2020-10-24 DIAGNOSIS — Z803 Family history of malignant neoplasm of breast: Secondary | ICD-10-CM | POA: Insufficient documentation

## 2020-10-24 NOTE — Progress Notes (Signed)
REFERRING PROVIDER: Derek Jack, MD [phone], [place] [address]  PRIMARY PROVIDER:  Patient, No Pcp Per (Inactive)  PRIMARY REASON FOR VISIT:  1. Family history of gene mutation   2. Family history of breast cancer     HISTORY OF PRESENT ILLNESS:   Patricia Fleming, a 25 y.o. female, was seen for a [facility] cancer genetics consultation at the request of Dr. Delton Coombes due to a family history of a RAD51C mutation in her mother.  Patricia Fleming presents to clinic today to discuss the possibility of a hereditary predisposition to cancer, to discuss genetic testing, and to further clarify her future cancer risks, as well as potential cancer risks for family members.   Patricia Fleming is a 25 y.o. female with no personal history of cancer.    CANCER HISTORY:  Oncology History   No history exists.      Past Medical History:  Diagnosis Date   Anxiety    Depression    High cholesterol     No past surgical history on file.  Social History   Socioeconomic History   Marital status: Single    Spouse name: Not on file   Number of children: Not on file   Years of education: Not on file   Highest education level: Not on file  Occupational History   Not on file  Tobacco Use   Smoking status: Never   Smokeless tobacco: Never  Substance and Sexual Activity   Alcohol use: Yes    Comment: occasionally   Drug use: Yes    Types: Marijuana    Comment: occasionally   Sexual activity: Not on file  Other Topics Concern   Not on file  Social History Narrative   Not on file   Social Determinants of Health   Financial Resource Strain: Not on file  Food Insecurity: Not on file  Transportation Needs: Not on file  Physical Activity: Not on file  Stress: Not on file  Social Connections: Not on file     FAMILY HISTORY:  We obtained a detailed, 4-generation family history.  Significant diagnoses are listed below:  Family History  Problem Relation Age of Onset   Breast cancer  Mother 73       RAD51C mutation   Breast cancer Maternal Grandmother 98   Lung cancer Maternal Grandfather         Patricia Fleming mother was diagnosed with triple negative breast caner at 72 and has one pathogenic variant in the RAD51C gene ([id number] (Splice acceptor)). Her maternal grandmother was diagnosed with breast cancer at 82 and her maternal grandfather was diagnosed with lung cancer in his 48s. She reports no paternal family history of cancer.   GENETIC COUNSELING ASSESSMENT: Patricia Fleming is a 25 y.o. female with a family history of breast cancer and a RAD51C gene mutation. We, therefore, discussed and recommended the following at today's visit.   DISCUSSION:  We discussed the cancer risks and management recommendations for individuals with a RAD51C gene mutation as noted below.  Cancer Risks for RAD51C: Female breast cancer, 20-40% risk Ovarian cancer, 10-15% Currently, there is no known increase in cancer risk for males with a RAD51C mutation.   Management Recommendations:   Breast Cancer Screening/Risk Reduction:   Women: Breast cancer screening includes: Breast awareness beginning at age 90 Monthly self-breast examination beginning at age 63 Clinical breast examination every 6-12 months beginning at age 65 or at the age of the earliest diagnosed breast cancer in  the family, if onset was before age 35 Annual mammogram with consideration of tomosynthesis starting at age 9 or 30 years prior to the youngest age of diagnosis, whichever comes first Consider breast MRI with contrast starting at age 53 Evidence is insufficient for a prophylactic risk-reducing mastectomy, manage based on family history    Ovarian Cancer Screening/Risk Reduction: Consider having a risk-reducing salpingo oophorectomy (RRSO), removal of the ovaries and fallopian tubes, between the ages of 34-50 or earlier based on a specific family history of an earlier onset ovarian cancer. Having a RRSO is  estimated to reduce the risk of ovarian cancer by up to 96%. There is still a small risk of developing an "ovarian-like" cancer in the lining of the abdomen, called the peritoneum.    Additional Considerations and Screening Recommendations: Individuals with a single pathogenic variant in RAD51C are also carriers of autosomal recessive Fanconi anemia. Fanconi anemia is characterized by bone marrow failure with variable additional anomalies that often include short stature, abnormal skin pigmentation, abnormal thumbs, malformations of the skeletal and central nervous systems, and developmental delay. Risks for leukemia and early-onset solid tumors are significantly elevated with this disorder. For there to be a risk of Fanconi anemia in offspring, both parents would each have to have a pathogenic variant in RAD51C; in this case, the risk of having an affected child is 25%.   This information is based on current understanding of the gene and may change in the future.   Implications for Family Members: Hereditary predisposition to cancer due to pathogenic variants in the RAD51C gene has autosomal dominant inheritance. This means that an individual with a pathogenic variant has a 50% chance of passing the condition on to his/her offspring. Identification of a pathogenic variant allows for the recognition of at-risk relatives who can pursue testing for the familial variant.  GINA: We discussed that some people do not want to undergo genetic testing due to fear of genetic discrimination.  A federal law called the Genetic Information Non-Discrimination Act (GINA) of 2008 helps protect individuals against genetic discrimination based on their genetic test results.  It impacts both health insurance and employment.  With health insurance, it protects against increased premiums, being kicked off insurance or being forced to take a test in order to be insured.  For employment it protects against hiring, firing and  promoting decisions based on genetic test results.  GINA does not apply to those in the [employer], those who work for companies with less than 15 employees, and new life insurance or long-term disability insurance policies.  Health status due to a cancer diagnosis is not protected under GINA.  PLAN: After considering the risks, benefits, and limitations, Patricia Fleming provided informed consent to pursue [facility]'s free familial RAD51C genetic testing. A saliva kit will be mailed to her home. Results should be available within approximately 2-3 weeks' time, at which point they will be disclosed by telephone to Patricia Fleming, as will any additional recommendations warranted by these results. Patricia Fleming will receive a summary of her genetic counseling visit and a copy of her results once available. This information will also be available in Epic.   Patricia Fleming questions were answered to her satisfaction today. Our contact information was provided should additional questions or concerns arise. Thank you for the referral and allowing [place] to share in the care of your patient.   Lucille Passy, MS, [facility] Genetic Counselor [email or web address] (P) [phone]  The patient was seen for a total of 25  minutes in face-to-face genetic counseling.  The patient brought her mother and brother. Drs. Magrinat, Lindi Adie and/or Burr Medico were available to discuss this case as needed.  _______________________________________________________________________ For Office Staff:  Number of people involved in session: 3 Was an Intern/ student involved with case: no

## 2020-11-14 ENCOUNTER — Telehealth: Payer: Medicaid Other | Admitting: Emergency Medicine

## 2020-11-14 DIAGNOSIS — J4521 Mild intermittent asthma with (acute) exacerbation: Secondary | ICD-10-CM

## 2020-11-14 DIAGNOSIS — J329 Chronic sinusitis, unspecified: Secondary | ICD-10-CM

## 2020-11-14 MED ORDER — ALBUTEROL SULFATE HFA 108 (90 BASE) MCG/ACT IN AERS: 1.0000 | INHALATION_SPRAY | RESPIRATORY_TRACT | 0 refills | Status: DC | PRN

## 2020-11-14 MED ORDER — DOXYCYCLINE HYCLATE 100 MG PO TABS: 100.0000 mg | ORAL_TABLET | Freq: Two times a day (BID) | ORAL | 0 refills | Status: DC

## 2020-11-14 MED ORDER — PREDNISONE 5 MG (21) PO TBPK: ORAL_TABLET | ORAL | 0 refills | Status: DC

## 2020-11-14 NOTE — Progress Notes (Signed)
E-Visit for Sinus Problems  We are sorry that you are not feeling well.  Here is how we plan to help!  Based on what you have shared with me it looks like you have sinusitis.  Sinusitis is inflammation and infection in the sinus cavities of the head.  Based on your presentation I believe you most likely have Acute Bacterial Sinusitis.  This is an infection caused by bacteria and is treated with antibiotics. I have prescribed Doxycycline 100mg  by mouth twice a day for 10 days. I will also prescribe prednisone and refill your albuterol inhaler as requested. You may use an oral decongestant such as Mucinex D or if you have glaucoma or high blood pressure use plain Mucinex. Saline nasal spray help and can safely be used as often as needed for congestion.  If you develop worsening sinus pain, fever or notice severe headache and vision changes, or if symptoms are not better after completion of antibiotic, please schedule an appointment with a health care provider.    You have had several sinus infections in the last year. I strongly urge  you to see an Ear/Nose/Throat (ENT) specialist about this. There are 3 main ENT practices in [place]:   1. [place], [place] [phone] 2. [phone], [place] [phone] 3. [facility] [phone]  Please call one of the above options to set up an appointment as soon as possible for recurrent sinus infections.    Sinus infections are not as easily transmitted as other respiratory infection, however we still recommend that you avoid close contact with loved ones, especially the very young and elderly.  Remember to wash your hands thoroughly throughout the day as this is the number one way to prevent the spread of infection!  Home Care: Only take medications as instructed by your medical team. Complete the entire course of an antibiotic. Do not take these medications with alcohol. A steam or ultrasonic humidifier can help congestion.  You  can place a towel over your head and breathe in the steam from hot water coming from a faucet. Avoid close contacts especially the very young and the elderly. Cover your mouth when you cough or sneeze. Always remember to wash your hands.  Get Help Right Away If: You develop worsening fever or sinus pain. You develop a severe head ache or visual changes. Your symptoms persist after you have completed your treatment plan.  Make sure you Understand these instructions. Will watch your condition. Will get help right away if you are not doing well or get worse.  Thank you for choosing an e-visit.  Your e-visit answers were reviewed by a board certified advanced clinical practitioner to complete your personal care plan. Depending upon the condition, your plan could have included both over the counter or prescription medications.  Please review your pharmacy choice. Make sure the pharmacy is open so you can pick up prescription now. If there is a problem, you may contact your provider through [phone] and have the prescription routed to another pharmacy.  Your safety is important to [employer]. If you have drug allergies check your prescription carefully.   For the next 24 hours you can use MyChart to ask questions about today's visit, request a non-urgent call back, or ask for a work or school excuse. You will get an email in the next two days asking about your experience. I hope that your e-visit has been valuable and will speed your recovery.  I have spent 5  minutes in review of e-visit questionnaire, review and updating patient chart, medical decision making and response to patient.   Rica Mast, PhD, FNP-BC

## 2020-12-23 ENCOUNTER — Telehealth: Payer: Medicaid Other | Admitting: Family

## 2020-12-23 DIAGNOSIS — R110 Nausea: Secondary | ICD-10-CM

## 2020-12-23 DIAGNOSIS — R197 Diarrhea, unspecified: Secondary | ICD-10-CM

## 2020-12-23 MED ORDER — ONDANSETRON HCL 4 MG PO TABS: 4.0000 mg | ORAL_TABLET | Freq: Three times a day (TID) | ORAL | 0 refills | Status: DC | PRN

## 2020-12-23 NOTE — Progress Notes (Signed)
We are sorry that you are not feeling well.  Here is how we plan to help!  Based on what you have shared with me it looks like you have Acute Infectious Diarrhea.  Most cases of acute diarrhea are due to infections with virus and bacteria and are self-limited conditions lasting less than 14 days.  For your symptoms you may take Imodium 2 mg tablets that are over the counter at your local pharmacy. Take two tablet now and then one after each loose stool up to 6 a day.  Antibiotics are not needed for most people with diarrhea.   Zofran 4 mg 1 tablet every 8 hours as needed for nausea and vomiting  I sent a work note to Pharmacologist. It will be under letters.   HOME CARE We recommend changing your diet to help with your symptoms for the next few days. Drink plenty of fluids that contain water salt and sugar. Sports drinks such as Gatorade may help.  You may try broths, soups, bananas, applesauce, soft breads, mashed potatoes or crackers.  You are considered infectious for as long as the diarrhea continues. Hand washing or use of alcohol based hand sanitizers is recommend. It is best to stay out of work or school until your symptoms stop.   GET HELP RIGHT AWAY If you have dark yellow colored urine or do not pass urine frequently you should drink more fluids.   If your symptoms worsen  If you feel like you are going to pass out (faint) You have a new problem  MAKE SURE YOU  Understand these instructions. Will watch your condition. Will get help right away if you are not doing well or get worse.  Thank you for choosing an e-visit.  Your e-visit answers were reviewed by a board certified advanced clinical practitioner to complete your personal care plan. Depending upon the condition, your plan could have included both over the counter or prescription medications.  Please review your pharmacy choice. Make sure the pharmacy is open so you can pick up prescription now. If there is a problem,  you may contact your provider through [employer] and have the prescription routed to another pharmacy.  Your safety is important to [place]. If you have drug allergies check your prescription carefully.   For the next 24 hours you can use MyChart to ask questions about today's visit, request a non-urgent call back, or ask for a work or school excuse. You will get an email in the next two days asking about your experience. I hope that your e-visit has been valuable and will speed your recovery.  Approximately 5 minutes was spent documenting and reviewing patient's chart.

## 2021-01-30 ENCOUNTER — Ambulatory Visit: Payer: Self-pay | Admitting: Genetic Counselor

## 2021-01-30 DIAGNOSIS — Z1379 Encounter for other screening for genetic and chromosomal anomalies: Secondary | ICD-10-CM | POA: Insufficient documentation

## 2021-01-30 NOTE — Progress Notes (Addendum)
HPI:   Ms. Shor was previously seen in the [place] clinic due to a family history of a gene mutation in her mother and concerns regarding a hereditary predisposition to cancer. Please refer to our prior cancer genetics clinic note for more information regarding our discussion, assessment and recommendations, at the time. Ms. Noack genetic test results were not disclosed to her verbally, because she did not return our phone calls and MyChart messages. These results and recommendations are discussed in more detail below.  CANCER HISTORY:  Oncology History   No history exists.    FAMILY HISTORY:  We obtained a detailed, 4-generation family history.  Significant diagnoses are listed below:        Family History  Problem Relation Age of Onset   Breast cancer Mother 75        RAD51C mutation   Breast cancer Maternal Grandmother 35   Lung cancer Maternal Grandfather               Ms. Grudzien mother was diagnosed with triple negative breast caner at 55 and has one pathogenic variant in the RAD51C gene (L.[id number]>S (Splice acceptor)). Her maternal grandmother was diagnosed with breast cancer at 7 and her maternal grandfather was diagnosed with lung cancer in his 4s. She reports no paternal family history of cancer.   GENETIC TEST RESULTS:  The [facility] single site genetic testing was Negative. Ms. Oka does NOT carry the RAD51C mutation identified in her mother.   The test report has been scanned into EPIC and is located under the Molecular Pathology section of the Results Review tab.  A portion of the result report is included below for reference. Genetic testing reported out on 01/29/2021.     We recommended Ms. Lemire pursue testing for the familial hereditary cancer gene mutation in the RAD51C gene. Ms. Reade test was normal and did not reveal the familial mutation. We call this result a true negative result because the cancer-causing mutation was identified in Ms.  Pitney family, and she did not inherit it. Given this negative result, Ms. Fitzgibbon chances of developing RAD51C-related cancers are the same as they are in the general population.    Cancer Screening Recommendations: An individual's cancer risk and medical management are not determined by genetic test results alone. Overall cancer risk assessment incorporates additional factors, including personal medical history, family history, and any available genetic information that may result in a personalized plan for cancer prevention and surveillance. Therefore, it is recommended she continue to follow the cancer management and screening guidelines provided by her primary healthcare provider.  Ms. Lockner is recommended to begin annual mammograms at age 26. At that time, we recommend a risk model such as CanRisk be performed to determine her lifetime risk for breast cancer. The CanRisk model will account for her mother's RAD51C gene mutation. Her maternal grandmother has not yet had genetic testing. Therefore, despite Ms. Hildebrandt negative genetic test results, she may still be at an elevated risk for breast cancer.   Recommendations for Family Members: Other members of the family may still carry the RAD51C pathogenic variant identified in her mother. We recommend her maternal aunt and maternal grandmother consider genetic counseling and testing. They can call us at [phone] to schedule an appointment.   Our contact number was provided. Ms. Taussig questions were answered to her satisfaction, and she knows she is welcome to call us at anytime with additional questions or concerns.   Lucille Passy, MS, [facility]  Genetic Counselor [email or web address] (P) [phone]

## 2021-02-01 ENCOUNTER — Telehealth: Payer: Medicaid Other | Admitting: Physician Assistant

## 2021-02-01 DIAGNOSIS — J4520 Mild intermittent asthma, uncomplicated: Secondary | ICD-10-CM

## 2021-02-02 MED ORDER — ALBUTEROL SULFATE HFA 108 (90 BASE) MCG/ACT IN AERS: 1.0000 | INHALATION_SPRAY | RESPIRATORY_TRACT | 0 refills | Status: DC | PRN

## 2021-02-02 NOTE — Progress Notes (Signed)
I have spent 5 minutes in review of e-visit questionnaire, review and updating patient chart, medical decision making and response to patient.   Kiani Wurtzel Cody Tysheem Accardo, PA-C    

## 2021-02-02 NOTE — Progress Notes (Signed)
Visit for Asthma  Based on what you have shared with me, it looks like you may have a flare up of your asthma.  Asthma is a chronic (ongoing) lung disease which results in airway obstruction, inflammation and hyper-responsiveness.   Asthma symptoms vary from person to person, with common symptoms including nighttime awakening and decreased ability to participate in normal activities as a result of shortness of breath. It is often triggered by changes in weather, changes in the season, changes in air temperature, or inside (home, school, daycare or work) allergens such as animal dander, mold, mildew, woodstoves or cockroaches.   It can also be triggered by hormonal changes, extreme emotion, physical exertion or an upper respiratory tract illness.     It is important to identify the trigger, and then eliminate or avoid the trigger if possible.   If you have been prescribed medications to be taken on a regular basis, it is important to follow the asthma action plan and to follow guidelines to adjust medication in response to increasing symptoms of decreased peak expiratory flow rate  Treatment: I have prescribed: Albuterol (Proventil HFA; Ventolin HFA) 108 (90 Base) MCG/ACT Inhaler 2 puffs into the lungs every six hours as needed for wheezing or shortness of breath  HOME CARE Only take medications as instructed by your medical team. Consider wearing a mask or scarf to improve breathing air temperature have been shown to decrease irritation and decrease exacerbations Get rest. Taking a steamy shower or using a humidifier may help nasal congestion sand ease sore throat pain. You can place a towel over your head and breathe in the steam from hot water coming from a faucet. Using a saline nasal spray works much the same way.  Cough drops, hare candies and sore throat lozenges may ease your  cough.  Avoid close contacts especially the very you and the elderly Cover your mouth if you cough or sneeze Always remember to wash your hands.    GET HELP RIGHT AWAY IF: You develop worsening symptoms; breathlessness at rest, drowsy, confused or agitated, unable to speak in full sentences You have coughing fits You develop a severe headache or visual changes You develop shortness of breath, difficulty breathing or start having chest pain Your symptoms persist after you have completed your treatment plan If your symptoms do not improve within 10 days  MAKE SURE YOU Understand these instructions. Will watch your condition. Will get help right away if you are not doing well or get worse.   Your e-visit answers were reviewed by a board certified advanced clinical practitioner to complete your personal care plan, Depending upon the condition, your plan could have included both over the counter or prescription medications.   Please review your pharmacy choice. Your safety is important to us. If you have drug allergies check your prescription carefully.  You can use MyChart to ask questions about today's visit, request a non-urgent  call back, or ask for a work or school excuse for 24 hours related to this e-Visit. If it has been greater than 24 hours you will need to follow up with your provider, or enter a new e-Visit to address those concerns.   You will get an e-mail in the next two days asking about your experience. I hope that your e-visit has been valuable and will speed your recovery. Thank you for using e-visits.  

## 2021-02-06 ENCOUNTER — Telehealth: Payer: Self-pay | Admitting: Genetic Counselor

## 2021-02-06 ENCOUNTER — Encounter: Payer: Self-pay | Admitting: Genetic Counselor

## 2021-02-06 NOTE — Telephone Encounter (Signed)
I attempted to contact Ms. Shaikh to discuss her genetic testing results. I left a voicemail requesting she call me back at [phone]. ° °Halley Kincer Rayanne Padmanabhan, MS, LCGC °Genetic Counselor °[email or web address] °(P) [phone] ° °

## 2021-02-10 ENCOUNTER — Telehealth: Payer: Self-pay | Admitting: Genetic Counselor

## 2021-02-10 NOTE — Telephone Encounter (Signed)
I attempted to contact Patricia Fleming to discuss her genetic testing results. I left a voicemail requesting she call me back at [phone].  Lalla Brothers, MS, [facility] Genetic Counselor [email or web address] (P) [phone]

## 2021-03-07 ENCOUNTER — Telehealth: Payer: Self-pay | Admitting: Genetic Counselor

## 2021-03-07 ENCOUNTER — Encounter: Payer: Self-pay | Admitting: Genetic Counselor

## 2021-03-07 NOTE — Telephone Encounter (Signed)
I attempted to contact Patricia Fleming to discuss her genetic testing results for the third time. I left a voicemail requesting she call me back at [phone]. I have also sent a MyChart message and will send another.    Lalla Brothers, MS, [facility] Genetic Counselor [email or web address] (P) [phone]

## 2021-03-28 ENCOUNTER — Telehealth: Payer: Medicaid Other | Admitting: Physician Assistant

## 2021-03-28 DIAGNOSIS — J4520 Mild intermittent asthma, uncomplicated: Secondary | ICD-10-CM

## 2021-03-28 MED ORDER — ALBUTEROL SULFATE HFA 108 (90 BASE) MCG/ACT IN AERS: 1.0000 | INHALATION_SPRAY | RESPIRATORY_TRACT | 0 refills | Status: DC | PRN

## 2021-03-28 NOTE — Progress Notes (Signed)
Visit for Asthma  Based on what you have shared with me, it looks like you may have a flare up of your asthma.  Asthma is a chronic (ongoing) lung disease which results in airway obstruction, inflammation and hyper-responsiveness.   Asthma symptoms vary from person to person, with common symptoms including nighttime awakening and decreased ability to participate in normal activities as a result of shortness of breath. It is often triggered by changes in weather, changes in the season, changes in air temperature, or inside (home, school, daycare or work) allergens such as animal dander, mold, mildew, woodstoves or cockroaches.   It can also be triggered by hormonal changes, extreme emotion, physical exertion or an upper respiratory tract illness.     It is important to identify the trigger, and then eliminate or avoid the trigger if possible.   If you have been prescribed medications to be taken on a regular basis, it is important to follow the asthma action plan and to follow guidelines to adjust medication in response to increasing symptoms of decreased peak expiratory flow rate  Treatment: I have prescribed: Albuterol (Proventil HFA; Ventolin HFA) 108 (90 Base) MCG/ACT Inhaler 2 puffs into the lungs every six hours as needed for wheezing or shortness of breath  HOME CARE Only take medications as instructed by your medical team. Consider wearing a mask or scarf to improve breathing air temperature have been shown to decrease irritation and decrease exacerbations Get rest. Taking a steamy shower or using a humidifier may help nasal congestion sand ease sore throat pain. You can place a towel over your head and breathe in the steam from hot water coming from a faucet. Using a saline nasal spray works much the same way.  Cough drops, hare candies and sore throat lozenges may ease your  cough.  Avoid close contacts especially the very you and the elderly Cover your mouth if you cough or sneeze Always remember to wash your hands.    GET HELP RIGHT AWAY IF: You develop worsening symptoms; breathlessness at rest, drowsy, confused or agitated, unable to speak in full sentences You have coughing fits You develop a severe headache or visual changes You develop shortness of breath, difficulty breathing or start having chest pain Your symptoms persist after you have completed your treatment plan If your symptoms do not improve within 10 days  MAKE SURE YOU Understand these instructions. Will watch your condition. Will get help right away if you are not doing well or get worse.   Your e-visit answers were reviewed by a board certified advanced clinical practitioner to complete your personal care plan, Depending upon the condition, your plan could have included both over the counter or prescription medications.   Please review your pharmacy choice. Your safety is important to us. If you have drug allergies check your prescription carefully.  You can use MyChart to ask questions about today's visit, request a non-urgent  call back, or ask for a work or school excuse for 24 hours related to this e-Visit. If it has been greater than 24 hours you will need to follow up with your provider, or enter a new e-Visit to address those concerns.   You will get an e-mail in the next two days asking about your experience. I hope that your e-visit has been valuable and will speed your recovery. Thank you for using e-visits.  

## 2021-03-28 NOTE — Progress Notes (Signed)
I have spent 5 minutes in review of e-visit questionnaire, review and updating patient chart, medical decision making and response to patient.   Ilena Dieckman Cody Sharaya Boruff, PA-C    

## 2021-05-16 ENCOUNTER — Telehealth: Payer: Medicaid Other | Admitting: Emergency Medicine

## 2021-05-16 DIAGNOSIS — R112 Nausea with vomiting, unspecified: Secondary | ICD-10-CM

## 2021-05-16 MED ORDER — ONDANSETRON HCL 4 MG PO TABS: 4.0000 mg | ORAL_TABLET | Freq: Three times a day (TID) | ORAL | 0 refills | Status: AC | PRN

## 2021-05-16 MED ORDER — ONDANSETRON 4 MG PO TBDP: 4.0000 mg | ORAL_TABLET | Freq: Three times a day (TID) | ORAL | 0 refills | Status: AC | PRN

## 2021-05-16 NOTE — Addendum Note (Signed)
Addended by: Roxy Horseman B on: 05/16/2021 07:05 PM ? ? Modules accepted: Orders ? ?

## 2021-05-16 NOTE — Progress Notes (Signed)
E-Visit for Vomiting ? ?We are sorry that you are not feeling well. Here is how we plan to help! ? ?I have prescribed a medication that will help alleviate your symptoms and allow you to stay hydrated: ? ?Zofran 4 mg 1 tablet every 8 hours as needed for nausea and vomiting ? ?The cause of your vomiting is unclear at this time, and could be multi-factorial.  If your condition worsens or doesn't improve by tomorrow, you need to be seen in person. ? ?I've sent a note for work for today and tomorrow to [employer]. ? ?HOME CARE: ?Drink clear liquids.  This is very important! Dehydration (the lack of fluid) can lead to a serious complication.  Start off with 1 tablespoon every 5 minutes for 8 hours. ?You may begin eating bland foods after 8 hours without vomiting.  Start with saltine crackers, white bread, rice, mashed potatoes, applesauce. ?After 48 hours on a bland diet, you may resume a normal diet. ?Try to go to sleep.  Sleep often empties the stomach and relieves the need to vomit. ? ?GET HELP RIGHT AWAY IF: ? ?Your symptoms do not improve or worsen within 2 days after treatment. ?You have a fever for over 3 days. ?You cannot keep down fluids after trying the medication. ? ?MAKE SURE YOU: ? ?Understand these instructions. ?Will watch your condition. ?Will get help right away if you are not doing well or get worse. ? ? ?Thank you for choosing an e-visit. ? ?Your e-visit answers were reviewed by a board certified advanced clinical practitioner to complete your personal care plan. Depending upon the condition, your plan could have included both over the counter or prescription medications. ? ?Please review your pharmacy choice. Make sure the pharmacy is open so you can pick up prescription now. If there is a problem, you may contact your provider through [employer] and have the prescription routed to another pharmacy.  Your safety is important to [place]. If you have drug allergies check your prescription carefully.   ? ?For the next 24 hours you can use MyChart to ask questions about today's visit, request a non-urgent call back, or ask for a work or school excuse. ?You will get an email in the next two days asking about your experience. I hope that your e-visit has been valuable and will speed your recovery. ? ?Approximately 5 minutes was used in reviewing the patient's chart, questionnaire, prescribing medications, and documentation. ? ?

## 2021-05-22 ENCOUNTER — Telehealth: Payer: Medicaid Other | Admitting: Physician Assistant

## 2021-05-22 DIAGNOSIS — J4520 Mild intermittent asthma, uncomplicated: Secondary | ICD-10-CM

## 2021-05-22 DIAGNOSIS — J4530 Mild persistent asthma, uncomplicated: Secondary | ICD-10-CM

## 2021-05-22 MED ORDER — ALBUTEROL SULFATE HFA 108 (90 BASE) MCG/ACT IN AERS: 1.0000 | INHALATION_SPRAY | RESPIRATORY_TRACT | 0 refills | Status: DC | PRN

## 2021-05-22 NOTE — Progress Notes (Signed)
Visit for Asthma ? ?Based on what you have shared with me, it looks like you may have a flare up of your asthma.  Asthma is a chronic (ongoing) lung disease which results in airway obstruction, inflammation and hyper-responsiveness.  ? ?Asthma symptoms vary from person to person, with common symptoms including nighttime awakening and decreased ability to participate in normal activities as a result of shortness of breath. It is often triggered by changes in weather, changes in the season, changes in air temperature, or inside (home, school, daycare or work) allergens such as animal dander, mold, mildew, woodstoves or cockroaches.   It can also be triggered by hormonal changes, extreme emotion, physical exertion or an upper respiratory tract illness.    ? ?It is important to identify the trigger, and then eliminate or avoid the trigger if possible.  ? ?If you have been prescribed medications to be taken on a regular basis, it is important to follow the asthma action plan and to follow guidelines to adjust medication in response to increasing symptoms of decreased peak expiratory flow rate ? ?Treatment: ?I have prescribed: Albuterol (Proventil HFA; Ventolin HFA) 108 (90 Base) MCG/ACT Inhaler 2 puffs into the lungs every six hours as needed for wheezing or shortness of breath ? ?HOME CARE ?Only take medications as instructed by your medical team. ?Consider wearing a mask or scarf to improve breathing air temperature have been shown to decrease irritation and decrease exacerbations ?Get rest. ?Taking a steamy shower or using a humidifier may help nasal congestion sand ease sore throat pain. You can place a towel over your head and breathe in the steam from hot water coming from a faucet. ?Using a saline nasal spray works much the same way.  ?Cough drops, hare candies and sore throat lozenges may ease your  cough.  ?Avoid close contacts especially the very you and the elderly ?Cover your mouth if you cough or sneeze ?Always remember to wash your hands.  ? ?Recommend to establish with a Primary Care Provider locally as soon as possible. Chronic medications we can only prescribe 90 day supply. With you needing the inhaler often and seasonal changes triggering you should have a provider following your asthma more regularly.  ? ?If you or a family member do not have a primary care provider, use the link below to schedule a visit and establish care. When you choose a [place] primary care physician or advanced practice provider, you gain a long-term partner in health. ?Find a Primary Care Provider ? ? ?GET HELP RIGHT AWAY IF: ?You develop worsening symptoms; breathlessness at rest, drowsy, confused or agitated, unable to speak in full sentences ?You have coughing fits ?You develop a severe headache or visual changes ?You develop shortness of breath, difficulty breathing or start having chest pain ?Your symptoms persist after you have completed your treatment plan ?If your symptoms do not improve within 10 days ? ?MAKE SURE YOU ?Understand these instructions. ?Will watch your condition. ?Will get help right away if you are not doing well or get worse.  ? ?Your e-visit answers were reviewed by a board certified advanced clinical practitioner to complete your personal care plan, Depending upon the condition, your plan could have included both over the counter or prescription medications.  ? ?Please review your pharmacy choice. ?Your safety is important to [place]. If you have drug allergies check your prescription carefully. ? ?You can use MyChart to ask questions about today's visit, request a non-urgent  ?call back, or ask  for a work or school excuse for 24 hours related to this e-Visit. If it has been greater than 24 hours you will need to follow up with your provider, or enter a new e-Visit to address those concerns.  ? ?You  will get an e-mail in the next two days asking about your experience. I hope that your e-visit has been valuable and will speed your recovery. Thank you for using e-visits. ? ? ?I provided 5 minutes of non face-to-face time during this encounter for chart review and documentation.  ? ?

## 2021-06-19 ENCOUNTER — Telehealth: Payer: Medicaid Other | Admitting: Physician Assistant

## 2021-06-19 DIAGNOSIS — J4520 Mild intermittent asthma, uncomplicated: Secondary | ICD-10-CM

## 2021-06-19 MED ORDER — ALBUTEROL SULFATE HFA 108 (90 BASE) MCG/ACT IN AERS: 1.0000 | INHALATION_SPRAY | RESPIRATORY_TRACT | 0 refills | Status: AC | PRN

## 2021-06-19 NOTE — Progress Notes (Signed)
Visit for Asthma  Based on what you have shared with me, it looks like you may have a flare up of your asthma.  Asthma is a chronic (ongoing) lung disease which results in airway obstruction, inflammation and hyper-responsiveness.   Asthma symptoms vary from person to person, with common symptoms including nighttime awakening and decreased ability to participate in normal activities as a result of shortness of breath. It is often triggered by changes in weather, changes in the season, changes in air temperature, or inside (home, school, daycare or work) allergens such as animal dander, mold, mildew, woodstoves or cockroaches.   It can also be triggered by hormonal changes, extreme emotion, physical exertion or an upper respiratory tract illness.     It is important to identify the trigger, and then eliminate or avoid the trigger if possible.   If you have been prescribed medications to be taken on a regular basis, it is important to follow the asthma action plan and to follow guidelines to adjust medication in response to increasing symptoms of decreased peak expiratory flow rate  Treatment: I have prescribed: Albuterol (Proventil HFA; Ventolin HFA) 108 (90 Base) MCG/ACT Inhaler 2 puffs into the lungs every six hours as needed for wheezing or shortness of breath  HOME CARE Only take medications as instructed by your medical team. Consider wearing a mask or scarf to improve breathing air temperature have been shown to decrease irritation and decrease exacerbations Get rest. Taking a steamy shower or using a humidifier may help nasal congestion sand ease sore throat pain. You can place a towel over your head and breathe in the steam from hot water coming from a faucet. Using a saline nasal spray works much the same way.  Cough drops, hare candies and sore throat lozenges may ease your  cough.  Avoid close contacts especially the very you and the elderly Cover your mouth if you cough or sneeze Always remember to wash your hands.    GET HELP RIGHT AWAY IF: You develop worsening symptoms; breathlessness at rest, drowsy, confused or agitated, unable to speak in full sentences You have coughing fits You develop a severe headache or visual changes You develop shortness of breath, difficulty breathing or start having chest pain Your symptoms persist after you have completed your treatment plan If your symptoms do not improve within 10 days  MAKE SURE YOU Understand these instructions. Will watch your condition. Will get help right away if you are not doing well or get worse.   Your e-visit answers were reviewed by a board certified advanced clinical practitioner to complete your personal care plan, Depending upon the condition, your plan could have included both over the counter or prescription medications.   Please review your pharmacy choice. Your safety is important to us. If you have drug allergies check your prescription carefully.  You can use MyChart to ask questions about today's visit, request a non-urgent  call back, or ask for a work or school excuse for 24 hours related to this e-Visit. If it has been greater than 24 hours you will need to follow up with your provider, or enter a new e-Visit to address those concerns.   You will get an e-mail in the next two days asking about your experience. I hope that your e-visit has been valuable and will speed your recovery. Thank you for using e-visits.  I provided 5 minutes of non face-to-face time during this encounter for chart review and documentation.   

## 2021-07-10 ENCOUNTER — Telehealth: Payer: Medicaid Other | Admitting: Physician Assistant

## 2021-07-10 DIAGNOSIS — J4540 Moderate persistent asthma, uncomplicated: Secondary | ICD-10-CM

## 2021-07-10 NOTE — Progress Notes (Signed)
Because of having recurrent asthma exacerbations, I feel your condition warrants further evaluation and I recommend that you be seen in a face to face visit. You need to discuss in person and possibly find a Primary Care Provider or Pulmonologist for long-term management of your asthma. We only manage acute issues, but since yours are recurrent and you are using an inhaler quicker than 2-3 weeks, you should truly be evaluated in person.    NOTE: There will be NO CHARGE for this eVisit   If you are having a true medical emergency please call 911.      For an urgent face to face visit, [place] has seven urgent care centers for your convenience:     [facility] Center at [facility] Directions [phone] [address] [place], [place] [address]    [facility] Health Urgent Care Center [facility]) Get Driving Directions [phone] [address] [place], [place] [address]  [facility] Health Urgent Care Center [facility]) Get Driving Directions [phone] [address] [place],  [place]  [address]  [facility] Health Urgent Care Center [facility] - at [employer] Directions  [phone] [address] [place],  [place] [address]   [facility] Health Urgent Care at [facility] Get Driving Directions [phone] [address] [place], [place] [address]   [facility] Health Urgent Care at [facility] Get Driving Directions  [phone] [address] [place], [place] [address]   [facility] Health Urgent Care at [facility] Directions [phone] [address] [place], [place] [address]  Your MyChart E-visit questionnaire answers were reviewed by a board certified advanced clinical practitioner to complete your personal care plan based on your specific symptoms.  Thank you for using e-Visits.   I provided 5 minutes of non face-to-face time during this  encounter for chart review and documentation.

## 2021-07-26 ENCOUNTER — Encounter: Payer: Self-pay | Admitting: Genetic Counselor

## 2021-10-17 ENCOUNTER — Telehealth: Payer: Medicaid Other | Admitting: Physician Assistant

## 2021-10-17 DIAGNOSIS — J329 Chronic sinusitis, unspecified: Secondary | ICD-10-CM

## 2021-10-17 NOTE — Progress Notes (Signed)
Because of ongoing and recurring symptoms and also concern for atypical infection and blockage in the nose which could be mucoid or possibly a nasal polyp, I feel your condition warrants further evaluation and I recommend that you be seen in a face to face visit. You likely may need a referral to an ENT provider which we cannot do via our virtual department.    NOTE: There will be NO CHARGE for this eVisit   If you are having a true medical emergency please call 911.      For an urgent face to face visit, [place] has seven urgent care centers for your convenience:     [place] [facility] [place] at [place] Get Driving Directions [phone] [place], [place] [address]    [place] [facility] [place] [facility]) Get Driving Directions [phone] [place], [place] [address]  [place] [facility] [place] ([place]) Get Driving Directions [phone] [address] [place],  [place]  [address]  [place] [facility] [place] [facility] - at [place] Get Driving Directions  [phone] [address] [place],  [place] [address]   [place] [facility] at [facility] Get Driving Directions [phone] [place], [place], [place] [address]   [place] [facility] at [facility] Get Driving Directions  [phone] [address] [place], [place] [address]   [place] [facility] at [place] Get Driving Directions [phone] [address], [place], [place] [address]  Your MyChart E-visit questionnaire answers were reviewed by a board certified advanced clinical practitioner to complete your personal care plan based on your specific symptoms.  Thank you for using e-Visits.
